# Patient Record
Sex: Female | Born: 1935 | Race: White | Hispanic: No | Marital: Married | Smoking: Never smoker
Health system: Southern US, Community
[De-identification: ages and names within clinical notes are randomized; demographics above are authoritative.]

## PROBLEM LIST (undated history)

## (undated) DIAGNOSIS — I10 Essential (primary) hypertension: Secondary | ICD-10-CM

## (undated) DIAGNOSIS — K219 Gastro-esophageal reflux disease without esophagitis: Secondary | ICD-10-CM

## (undated) DIAGNOSIS — E039 Hypothyroidism, unspecified: Secondary | ICD-10-CM

## (undated) DIAGNOSIS — N183 Chronic kidney disease, stage 3 unspecified: Secondary | ICD-10-CM

## (undated) DIAGNOSIS — R7303 Prediabetes: Secondary | ICD-10-CM

## (undated) DIAGNOSIS — E119 Type 2 diabetes mellitus without complications: Secondary | ICD-10-CM

## (undated) DIAGNOSIS — C9000 Multiple myeloma not having achieved remission: Secondary | ICD-10-CM

## (undated) DIAGNOSIS — J45909 Unspecified asthma, uncomplicated: Secondary | ICD-10-CM

## (undated) DIAGNOSIS — E785 Hyperlipidemia, unspecified: Secondary | ICD-10-CM

## (undated) DIAGNOSIS — C569 Malignant neoplasm of unspecified ovary: Secondary | ICD-10-CM

## (undated) DIAGNOSIS — D638 Anemia in other chronic diseases classified elsewhere: Secondary | ICD-10-CM

## (undated) DIAGNOSIS — D472 Monoclonal gammopathy: Secondary | ICD-10-CM

## (undated) DIAGNOSIS — G709 Myoneural disorder, unspecified: Secondary | ICD-10-CM

## (undated) HISTORY — PX: APPENDECTOMY: SHX54

## (undated) HISTORY — DX: Gastro-esophageal reflux disease without esophagitis: K21.9

## (undated) HISTORY — DX: Chronic kidney disease, stage 3 unspecified: N18.30

## (undated) HISTORY — DX: Anemia in other chronic diseases classified elsewhere: D63.8

## (undated) HISTORY — DX: Chronic kidney disease, stage 3 (moderate): N18.3

## (undated) HISTORY — DX: Essential (primary) hypertension: I10

## (undated) HISTORY — PX: TOTAL ABDOMINAL HYSTERECTOMY W/ BILATERAL SALPINGOOPHORECTOMY: SHX83

## (undated) HISTORY — DX: Myoneural disorder, unspecified: G70.9

## (undated) HISTORY — DX: Monoclonal gammopathy: D47.2

## (undated) HISTORY — PX: THYROIDECTOMY, PARTIAL: SHX18

## (undated) HISTORY — DX: Hyperlipidemia, unspecified: E78.5

## (undated) HISTORY — DX: Unspecified asthma, uncomplicated: J45.909

## (undated) HISTORY — DX: Prediabetes: R73.03

## (undated) HISTORY — DX: Malignant neoplasm of unspecified ovary: C56.9

## (undated) HISTORY — PX: INCONTINENCE SURGERY: SHX676

## (undated) HISTORY — DX: Type 2 diabetes mellitus without complications: E11.9

## (undated) HISTORY — DX: Multiple myeloma not having achieved remission: C90.00

## (undated) HISTORY — DX: Hypothyroidism, unspecified: E03.9

## (undated) HISTORY — PX: OTHER SURGICAL HISTORY: SHX169

---

## 1970-12-27 DIAGNOSIS — C569 Malignant neoplasm of unspecified ovary: Secondary | ICD-10-CM

## 1970-12-27 HISTORY — DX: Malignant neoplasm of unspecified ovary: C56.9

## 2006-12-27 HISTORY — PX: COLONOSCOPY: SHX174

## 2012-05-30 ENCOUNTER — Telehealth: Payer: Self-pay | Admitting: Oncology

## 2012-05-30 NOTE — Telephone Encounter (Signed)
called pts home lmovm to rtn call to schedule appt °

## 2012-05-31 ENCOUNTER — Telehealth: Payer: Self-pay | Admitting: Oncology

## 2012-05-31 NOTE — Telephone Encounter (Signed)
called pts home lmovm to rtn call to schedule appt °

## 2012-06-01 ENCOUNTER — Telehealth: Payer: Self-pay | Admitting: Oncology

## 2012-06-01 NOTE — Telephone Encounter (Signed)
Referred by Dr. Elvis Coil Dx- M-Spike

## 2012-06-13 ENCOUNTER — Encounter: Payer: Self-pay | Admitting: Oncology

## 2012-06-13 DIAGNOSIS — N184 Chronic kidney disease, stage 4 (severe): Secondary | ICD-10-CM | POA: Insufficient documentation

## 2012-06-13 DIAGNOSIS — D472 Monoclonal gammopathy: Secondary | ICD-10-CM | POA: Insufficient documentation

## 2012-06-13 DIAGNOSIS — E785 Hyperlipidemia, unspecified: Secondary | ICD-10-CM | POA: Insufficient documentation

## 2012-06-13 DIAGNOSIS — I10 Essential (primary) hypertension: Secondary | ICD-10-CM | POA: Insufficient documentation

## 2012-06-13 DIAGNOSIS — E039 Hypothyroidism, unspecified: Secondary | ICD-10-CM | POA: Insufficient documentation

## 2012-06-13 NOTE — Patient Instructions (Addendum)
A.  Issue:  Abnormal protein in the urine. B.  Need to rule out multiple myeloma or related diseases such as light chain deposition disease or amyloidosis. C.  Work up:  24 hour urine collection.  Bone marrow biopsy; potential kidney biopsy; skeletal X-ray to rule out bone lytic lesions.  D.  Follow up:  After these tests to go over result and if there is any need for treatment.

## 2012-06-14 ENCOUNTER — Ambulatory Visit (HOSPITAL_BASED_OUTPATIENT_CLINIC_OR_DEPARTMENT_OTHER): Payer: Medicare Other | Admitting: Oncology

## 2012-06-14 ENCOUNTER — Ambulatory Visit: Payer: Self-pay

## 2012-06-14 ENCOUNTER — Telehealth: Payer: Self-pay | Admitting: Oncology

## 2012-06-14 ENCOUNTER — Encounter: Payer: Self-pay | Admitting: Oncology

## 2012-06-14 ENCOUNTER — Other Ambulatory Visit (HOSPITAL_BASED_OUTPATIENT_CLINIC_OR_DEPARTMENT_OTHER): Payer: Medicare Other | Admitting: Lab

## 2012-06-14 VITALS — BP 185/79 | HR 66 | Temp 96.9°F | Ht 63.0 in | Wt 184.4 lb

## 2012-06-14 DIAGNOSIS — E785 Hyperlipidemia, unspecified: Secondary | ICD-10-CM

## 2012-06-14 DIAGNOSIS — E039 Hypothyroidism, unspecified: Secondary | ICD-10-CM

## 2012-06-14 DIAGNOSIS — R809 Proteinuria, unspecified: Secondary | ICD-10-CM

## 2012-06-14 DIAGNOSIS — R0602 Shortness of breath: Secondary | ICD-10-CM

## 2012-06-14 DIAGNOSIS — D472 Monoclonal gammopathy: Secondary | ICD-10-CM

## 2012-06-14 DIAGNOSIS — I10 Essential (primary) hypertension: Secondary | ICD-10-CM

## 2012-06-14 DIAGNOSIS — N189 Chronic kidney disease, unspecified: Secondary | ICD-10-CM

## 2012-06-14 DIAGNOSIS — I129 Hypertensive chronic kidney disease with stage 1 through stage 4 chronic kidney disease, or unspecified chronic kidney disease: Secondary | ICD-10-CM

## 2012-06-14 NOTE — Progress Notes (Signed)
[facility]  Telephone:(336) [phone] Fax:(336) [phone]     INITIAL HEMATOLOGY CONSULTATION    Referral MD:  Dr. Elvis Coil, M.D.   Reason for Referral: positive UPEP.    HPI:  Jessica Byrd is an 78 year old woman with history of HTN, HLP.  She was recently noticed to have elevated Cr.  She was referred to Dr. Hebert Soho who found her to have positive UPEP.  She was thus kindly referred to the [facility] for evaluation.  Jessica Byrd presented to the Cancer Center for the first time today with her son.  She reports that she feels relatively well.  She noticed that her urine output has slightly decreased over the last few months.  She denied frothy urine.  She does have baseline SOB and DOE with more than 100 ft.  This has been ongoing for years.  She denied pedal edema, PND, orthopnea.  She has thin skin and easy bruisabilty for years.  She denied visible source of bleeding.  She has mild fatigue; however, she is still independent of activities of daily living.   Patient denies fever, anorexia, weight loss, headache, visual changes, confusion, drenching night sweats, palpable lymph node swelling, mucositis, odynophagia, dysphagia, nausea vomiting, jaundice, chest pain, palpitation, productive cough, gum bleeding, epistaxis, hematemesis, hemoptysis, abdominal pain, abdominal swelling, early satiety, melena, hematochezia, hematuria, skin rash, spontaneous bleeding, joint swelling, joint pain, heat or cold intolerance, bowel bladder incontinence, back pain, focal motor weakness, paresthesia, depression, suicidal or homocidal ideation, feeling hopelessness.    Past Medical History  Diagnosis Date  . HTN (hypertension)   . Hyperlipidemia   . Asthma   . GERD (gastroesophageal reflux disease)   . Hypothyroid   . Monoclonal gammopathies   . CKD (chronic kidney disease) stage 3, GFR 30-59 ml/min   . Borderline diabetes mellitus   . Ovarian cancer 1972   reportedly early stage; s/p TAH/BSO and adjuvant radaition.  :    Past Surgical History  Procedure Date  . Total abdominal hysterectomy w/ bilateral salpingoophorectomy   . Umibical hernia repair   . Right rotator cuff surgery   . Thyroidectomy, partial   . Vocal cord repair   . Colonoscopy 2008    reportedly negative per patient.   :   CURRENT MEDS: Current Outpatient Prescriptions  Medication Sig Dispense Refill  . albuterol (VENTOLIN HFA) 108 (90 BASE) MCG/ACT inhaler Inhale 2 puffs into the lungs every 6 (six) hours as needed.      Marland Kitchen amLODipine (NORVASC) 10 MG tablet Take 10 mg by mouth daily.      Marland Kitchen aspirin 81 MG tablet Take 81 mg by mouth every other day.      . Calcium-Vitamin D (CALTRATE 600 PLUS-VIT D PO) Take 1 capsule by mouth 2 (two) times daily.      . Glucosamine-Chondroit-Vit C-Mn (GLUCOSAMINE CHONDR 1500 COMPLX PO) Take by mouth daily.      Marland Kitchen levothyroxine (SYNTHROID, LEVOTHROID) 75 MCG tablet Take 75 mcg by mouth daily.      Marland Kitchen lisinopril-hydrochlorothiazide (PRINZIDE,ZESTORETIC) 20-12.5 MG per tablet Take 1 tablet by mouth daily.      Marland Kitchen loperamide (IMODIUM A-D) 2 MG tablet Take 2 mg by mouth 2 (two) times daily.      Marland Kitchen omeprazole (PRILOSEC) 20 MG capsule Take 20 mg by mouth daily.      . pravastatin (PRAVACHOL) 40 MG tablet Take 80 mg by mouth daily.      . psyllium (METAMUCIL)  58.6 % powder Take 1 packet by mouth 2 (two) times daily.          Allergies  Allergen Reactions  . Penicillins Other (See Comments)    Stiffness in hands  :  Family History  Problem Relation Age of Onset  . Cancer Father 25    gastric   . Cancer Sister     lung cancer  . Cancer Brother 57    cholangiocarcinoma  :  History   Social History  . Marital Status: Married    Spouse Name: N/A    Number of Children: 1  . Years of Education: N/A   Occupational History  .      retired.    Social History Main Topics  . Smoking status: Not on file  . Smokeless tobacco: Not  on file  . Alcohol Use:   . Drug Use:   . Sexually Active:    Other Topics Concern  . Not on file   Social History Narrative  . No narrative on file  :  REVIEW OF SYSTEM:  The rest of the 14-point review of sytem was negative.   Exam: ECOG 1  General:  Mildly obese woman in no acute distress.  Eyes:  no scleral icterus.  ENT:  There were no oropharyngeal lesions.  There were scalloping marks on her lateral tongue.  Neck was without thyromegaly.  Lymphatics:  Negative cervical, supraclavicular or axillary adenopathy.  Respiratory: lungs were clear bilaterally without wheezing or crackles.  Cardiovascular:  Regular rate and rhythm, S1/S2, without murmur, rub or gallop.  There was no pedal edema.  GI:  abdomen was soft, flat, nontender, nondistended, without organomegaly.  Muscoloskeletal:  no spinal tenderness of palpation of vertebral spine.  Skin exam was without echymosis, petichae.  Neuro exam was nonfocal.  Patient was able to get on and off exam table without assistance.  Gait was normal.  Patient was alerted and oriented.  Attention was good.   Language was appropriate.  Mood was normal without depression.  Speech was not pressured.  Thought content was not tangential.    LABS:  From Dr. Marland Mcalpine office dated 05/16/2012 WBC 8.2; Hgb 11.1; Hct 32.6; MCV 93; Plt 205 Cr 1.2; Tprot 6.6; Alb 4.2; Ca 9.6.  Rest of LFT within normal limit.  SPEP:  Negative. UPEP:  Positive for kappa Bence Jones Protein.   ASSESSMENT AND PLAN:   1.   Positive UPEP for kappa Bence Jones Protein:   - Differentials:  Need to rule out multiple myeloma or related diseases such as light chain deposition disease or amyloidosis.  If negative, then the dx would be MGUS.  -Work up:  24 hour urine collection to quantify the total protein and amount of Bence Jones Protein.    Bone marrow biopsy to assess for % of plasma cell; kidney biopsy to rule out light chain deposition disease or amyloid; skeletal X-ray to rule out  bone lytic lesions.   She and her son agreed with this plan and wished to proceed.  - Treatment:  Pending result of work up.   2.  Hypertension:  Elevated BP today with first visit to the Cancer Center.  She is on amlodipine, Lisinopril/HCTZ.   3.  Hyperlipidemia:  She is on pravastatin.  4.  Chronic kidney disease, stage III:  Either from HTN.  But need to rule out plasma cell dyscrasia process (see #1).  5.  Cough, SOB:  Attributed to asthma.  However, if she  has amyloidosis, I will then request an echo to rule out CHF.     Thank you for this referral.     The length of time of the face-to-face encounter was 60 minutes. More than 50% of time was spent counseling and coordination of care.

## 2012-06-14 NOTE — Telephone Encounter (Signed)
gv pt appt for [id number].  scheduled bone survey for 06/25 @ [facility].  Informed pt that [facility] will call to scheduled bone marrow appt

## 2012-06-15 LAB — KAPPA/LAMBDA LIGHT CHAINS: Kappa:Lambda Ratio: 4.65 — ABNORMAL HIGH (ref 0.26–1.65)

## 2012-06-15 LAB — IGG, IGA, IGM
IgA: 261 mg/dL (ref 69–380)
IgG (Immunoglobin G), Serum: 878 mg/dL (ref 690–1700)

## 2012-06-20 ENCOUNTER — Ambulatory Visit (HOSPITAL_COMMUNITY)
Admission: RE | Admit: 2012-06-20 | Discharge: 2012-06-20 | Disposition: A | Discharge status: Home / Self Care | Payer: Medicare Other | Source: Ambulatory Visit | Attending: Oncology | Admitting: Oncology

## 2012-06-20 ENCOUNTER — Ambulatory Visit: Payer: Medicare Other

## 2012-06-20 DIAGNOSIS — M949 Disorder of cartilage, unspecified: Secondary | ICD-10-CM | POA: Insufficient documentation

## 2012-06-20 DIAGNOSIS — M5030 Other cervical disc degeneration, unspecified cervical region: Secondary | ICD-10-CM | POA: Insufficient documentation

## 2012-06-20 DIAGNOSIS — I10 Essential (primary) hypertension: Secondary | ICD-10-CM

## 2012-06-20 DIAGNOSIS — E039 Hypothyroidism, unspecified: Secondary | ICD-10-CM

## 2012-06-20 DIAGNOSIS — X58XXXA Exposure to other specified factors, initial encounter: Secondary | ICD-10-CM | POA: Insufficient documentation

## 2012-06-20 DIAGNOSIS — E785 Hyperlipidemia, unspecified: Secondary | ICD-10-CM

## 2012-06-20 DIAGNOSIS — M899 Disorder of bone, unspecified: Secondary | ICD-10-CM | POA: Insufficient documentation

## 2012-06-20 DIAGNOSIS — M4120 Other idiopathic scoliosis, site unspecified: Secondary | ICD-10-CM | POA: Insufficient documentation

## 2012-06-20 DIAGNOSIS — N183 Chronic kidney disease, stage 3 unspecified: Secondary | ICD-10-CM

## 2012-06-20 DIAGNOSIS — S32009A Unspecified fracture of unspecified lumbar vertebra, initial encounter for closed fracture: Secondary | ICD-10-CM | POA: Insufficient documentation

## 2012-06-20 DIAGNOSIS — D472 Monoclonal gammopathy: Secondary | ICD-10-CM

## 2012-06-23 ENCOUNTER — Other Ambulatory Visit: Payer: Self-pay | Admitting: Radiology

## 2012-06-23 LAB — UIFE/LIGHT CHAINS/TP QN, 24-HR UR
Alpha 2, Urine: DETECTED — AB
Free Kappa/Lambda Ratio: 142.73 ratio — ABNORMAL HIGH (ref 2.04–10.37)
Free Lambda Lt Chains,Ur: 0.11 mg/dL (ref 0.02–0.67)
Free Lt Chn Excr Rate: 94.2 mg/d
Total Protein, Urine-Ur/day: 98 mg/d (ref 10–140)
Total Protein, Urine: 16.3 mg/dL

## 2012-06-26 ENCOUNTER — Other Ambulatory Visit: Payer: Self-pay | Admitting: Radiology

## 2012-06-27 ENCOUNTER — Other Ambulatory Visit: Payer: Self-pay | Admitting: Radiology

## 2012-06-28 ENCOUNTER — Ambulatory Visit (HOSPITAL_COMMUNITY)
Admission: RE | Admit: 2012-06-28 | Discharge: 2012-06-28 | Disposition: A | Discharge status: Home / Self Care | Payer: Medicare Other | Source: Ambulatory Visit | Attending: Oncology | Admitting: Oncology

## 2012-06-28 DIAGNOSIS — D472 Monoclonal gammopathy: Secondary | ICD-10-CM

## 2012-06-28 DIAGNOSIS — E785 Hyperlipidemia, unspecified: Secondary | ICD-10-CM

## 2012-06-28 DIAGNOSIS — I10 Essential (primary) hypertension: Secondary | ICD-10-CM

## 2012-06-28 DIAGNOSIS — E039 Hypothyroidism, unspecified: Secondary | ICD-10-CM

## 2012-06-28 DIAGNOSIS — D649 Anemia, unspecified: Secondary | ICD-10-CM | POA: Insufficient documentation

## 2012-06-28 DIAGNOSIS — C9000 Multiple myeloma not having achieved remission: Secondary | ICD-10-CM | POA: Insufficient documentation

## 2012-06-28 LAB — CBC
HCT: 34.8 % — ABNORMAL LOW (ref 36.0–46.0)
Hemoglobin: 11.6 g/dL — ABNORMAL LOW (ref 12.0–15.0)
MCH: 31.3 pg (ref 26.0–34.0)
MCHC: 33.3 g/dL (ref 30.0–36.0)
MCV: 93.8 fL (ref 78.0–100.0)
RDW: 12.9 % (ref 11.5–15.5)

## 2012-06-28 MED ORDER — MIDAZOLAM HCL 5 MG/5ML IJ SOLN
INTRAMUSCULAR | Status: AC | PRN
  Administered 2012-06-28 (×2): 2 mg via INTRAVENOUS

## 2012-06-28 MED ORDER — MIDAZOLAM HCL 2 MG/2ML IJ SOLN
INTRAMUSCULAR | Status: AC
  Filled 2012-06-28: qty 6

## 2012-06-28 MED ORDER — FENTANYL CITRATE 0.05 MG/ML IJ SOLN
INTRAMUSCULAR | Status: AC
  Filled 2012-06-28: qty 6

## 2012-06-28 MED ORDER — FENTANYL CITRATE 0.05 MG/ML IJ SOLN
INTRAMUSCULAR | Status: AC | PRN
  Administered 2012-06-28 (×2): 50 ug via INTRAVENOUS

## 2012-06-28 MED ORDER — SODIUM CHLORIDE 0.9 % IV SOLN
Freq: Once | INTRAVENOUS | Status: AC
  Administered 2012-06-28: 500 mL via INTRAVENOUS

## 2012-06-28 NOTE — Procedures (Signed)
Renal Bx, Core, R kidney, 16 g times 3 BM Bx R iliac 11 gauge times 1 No comp

## 2012-06-28 NOTE — H&P (Signed)
Jessica Byrd is an 78 y.o. female.   Chief Complaint: "I'm here for a bone marrow and kidney biopsy" HPI: Patient with history of positive UPEP for kappa Bence Jones Protein, HTN, and  chronic kidney disease presents today for CT guided bone marrow and random renal biopsies.  Past Medical History  Diagnosis Date  . HTN (hypertension)   . Hyperlipidemia   . Asthma   . GERD (gastroesophageal reflux disease)   . Hypothyroid   . Monoclonal gammopathies   . CKD (chronic kidney disease) stage 3, GFR 30-59 ml/min   . Borderline diabetes mellitus   . Ovarian cancer 1972    reportedly early stage; s/p TAH/BSO and adjuvant radaition.    Past Surgical History  Procedure Date  . Total abdominal hysterectomy w/ bilateral salpingoophorectomy   . Umibical hernia repair   . Right rotator cuff surgery   . Thyroidectomy, partial   . Vocal cord repair   . Colonoscopy 2008    reportedly negative per patient.     Family History  Problem Relation Age of Onset  . Cancer Father 10    gastric   . Cancer Sister     lung cancer  . Cancer Brother 36    cholangiocarcinoma   Social History:  does not have a smoking history on file. She does not have any smokeless tobacco history on file. Her alcohol and drug histories not on file.  Allergies:  Allergies  Allergen Reactions  . Penicillins Other (See Comments)    Stiffness in hands    Current outpatient prescriptions:albuterol (VENTOLIN HFA) 108 (90 BASE) MCG/ACT inhaler, Inhale 2 puffs into the lungs every 6 (six) hours as needed. For wheezing, Disp: , Rfl: ;  amLODipine (NORVASC) 10 MG tablet, Take 10 mg by mouth daily with breakfast. , Disp: , Rfl: ;  aspirin 81 MG tablet, Take 81 mg by mouth every other day., Disp: , Rfl:  Calcium-Vitamin D (CALTRATE 600 PLUS-VIT D PO), Take 1 capsule by mouth 2 (two) times daily., Disp: , Rfl: ;  Glucosamine-Chondroit-Vit C-Mn (GLUCOSAMINE CHONDR 1500 COMPLX PO), Take 1 tablet by mouth daily. , Disp: ,  Rfl: ;  levothyroxine (SYNTHROID, LEVOTHROID) 75 MCG tablet, Take 75 mcg by mouth daily before breakfast. , Disp: , Rfl:  lisinopril-hydrochlorothiazide (PRINZIDE,ZESTORETIC) 20-12.5 MG per tablet, Take 1 tablet by mouth daily with breakfast. , Disp: , Rfl: ;  loperamide (IMODIUM A-D) 2 MG tablet, Take 2 mg by mouth daily with breakfast. , Disp: , Rfl: ;  omeprazole (PRILOSEC) 20 MG capsule, Take 20 mg by mouth daily with breakfast. , Disp: , Rfl: ;  pravastatin (PRAVACHOL) 40 MG tablet, Take 80 mg by mouth every evening. , Disp: , Rfl:  psyllium (METAMUCIL) 58.6 % powder, Take 1 packet by mouth 2 (two) times daily., Disp: , Rfl:  No current facility-administered medications for this encounter. Facility-Administered Medications Ordered in Other Encounters: 0.9 %  sodium chloride infusion, , Intravenous, Once, Brayton El, PA, Last Rate: 20 mL/hr at 06/28/12 0814, 500 mL at 06/28/12 0814;  fentaNYL (SUBLIMAZE) 0.05 MG/ML injection, , , , ;  midazolam (VERSED) 2 MG/2ML injection, , , ,    Results for orders placed during the hospital encounter of 06/28/12 (from the past 48 hour(s))  GLUCOSE, CAPILLARY     Status: Abnormal   Collection Time   06/28/12  8:19 AM      Component Value Range Comment   Glucose-Capillary 117 (*) 70 - 99 mg/dL    Results  for orders placed during the hospital encounter of 06/28/12  GLUCOSE, CAPILLARY      Component Value Range   Glucose-Capillary 117 (*) 70 - 99 mg/dL   Results for orders placed during the hospital encounter of 06/28/12  GLUCOSE, CAPILLARY      Component Value Range   Glucose-Capillary 117 (*) 70 - 99 mg/dL  07/03/45   WBC 8.0  HGB  11.6  PLTS  190K   PT  13  INR  0.96  PTT 32  Review of Systems  Constitutional: Positive for malaise/fatigue. Negative for fever, chills and weight loss.  Respiratory: Positive for cough and shortness of breath.   Cardiovascular: Negative for chest pain.  Gastrointestinal: Negative for nausea, vomiting and abdominal pain.   Musculoskeletal: Negative for back pain.  Neurological: Negative for headaches.  Endo/Heme/Allergies: Does not bruise/bleed easily.    Blood pressure 159/66, pulse 70, temperature 98.4 F (36.9 C), temperature source Oral, resp. rate 18, height 5\' 3"  (1.6 m), weight 184 lb (83.462 kg), SpO2 99.00%. Physical Exam  Constitutional: She is oriented to person, place, and time. She appears well-developed and well-nourished.  Cardiovascular: Normal rate and regular rhythm.   Respiratory: Effort normal.       Few scattered insp/exp wheezes  GI: Soft. Bowel sounds are normal. There is no tenderness.  Musculoskeletal: Normal range of motion. She exhibits no edema.  Neurological: She is alert and oriented to person, place, and time.     Assessment/Plan Patient with positive UPEP for kappa Bence Jones Protein, HTN and chronic kidney disease; plan is for CT guided bone marrow and random renal biopsies. Details/risks of above d/w pt /son with their understanding and consent.  Geordie Nooney,D KEVIN 06/28/2012, 8:47 AM

## 2012-07-07 ENCOUNTER — Other Ambulatory Visit (HOSPITAL_BASED_OUTPATIENT_CLINIC_OR_DEPARTMENT_OTHER): Payer: Medicare Other | Admitting: Lab

## 2012-07-07 ENCOUNTER — Ambulatory Visit (HOSPITAL_BASED_OUTPATIENT_CLINIC_OR_DEPARTMENT_OTHER): Payer: Medicare Other | Admitting: Oncology

## 2012-07-07 ENCOUNTER — Telehealth: Payer: Self-pay | Admitting: Oncology

## 2012-07-07 ENCOUNTER — Encounter: Payer: Self-pay | Admitting: Oncology

## 2012-07-07 VITALS — BP 180/76 | HR 72 | Temp 97.5°F | Ht 63.0 in | Wt 186.2 lb

## 2012-07-07 DIAGNOSIS — I10 Essential (primary) hypertension: Secondary | ICD-10-CM

## 2012-07-07 DIAGNOSIS — D472 Monoclonal gammopathy: Secondary | ICD-10-CM

## 2012-07-07 DIAGNOSIS — E785 Hyperlipidemia, unspecified: Secondary | ICD-10-CM

## 2012-07-07 DIAGNOSIS — E039 Hypothyroidism, unspecified: Secondary | ICD-10-CM

## 2012-07-07 HISTORY — DX: Monoclonal gammopathy: D47.2

## 2012-07-07 LAB — CBC WITH DIFFERENTIAL/PLATELET
Basophils Absolute: 0 10*3/uL (ref 0.0–0.1)
Eosinophils Absolute: 0.2 10*3/uL (ref 0.0–0.5)
HCT: 33 % — ABNORMAL LOW (ref 34.8–46.6)
HGB: 11 g/dL — ABNORMAL LOW (ref 11.6–15.9)
LYMPH%: 16.7 % (ref 14.0–49.7)
MCV: 94.9 fL (ref 79.5–101.0)
MONO%: 8.8 % (ref 0.0–14.0)
NEUT#: 6.5 10*3/uL (ref 1.5–6.5)
NEUT%: 71.5 % (ref 38.4–76.8)
Platelets: 172 10*3/uL (ref 145–400)
RDW: 12.8 % (ref 11.2–14.5)

## 2012-07-07 LAB — COMPREHENSIVE METABOLIC PANEL
Albumin: 4 g/dL (ref 3.5–5.2)
Alkaline Phosphatase: 62 U/L (ref 39–117)
BUN: 35 mg/dL — ABNORMAL HIGH (ref 6–23)
Creatinine, Ser: 1.28 mg/dL — ABNORMAL HIGH (ref 0.50–1.10)
Glucose, Bld: 118 mg/dL — ABNORMAL HIGH (ref 70–99)
Potassium: 4.2 mEq/L (ref 3.5–5.3)

## 2012-07-07 NOTE — Progress Notes (Signed)
[facility]  Telephone:(336) [phone] Fax:(336) [phone]   OFFICE PROGRESS NOTE   Cc:  DAVIS,SALLY, PA  DIAGNOSIS:  Monoclonal gammopathy of undetermined significance (MGUS)   CURRENT THERAPY: Watchful observation.   INTERVAL HISTORY: Jessica Byrd 78 y.o. female returns for regular follow up with her son.  She reports feeling well.  Her bone marrow biopsy site and the left kidney biopsy site have healed.  She denied back pain or flank pain.    Patient denies fever, anorexia, weight loss, fatigue, headache, visual changes, confusion, drenching night sweats, palpable lymph node swelling, mucositis, odynophagia, dysphagia, nausea vomiting, jaundice, chest pain, palpitation, shortness of breath, dyspnea on exertion, productive cough, gum bleeding, epistaxis, hematemesis, hemoptysis, abdominal pain, abdominal swelling, early satiety, melena, hematochezia, hematuria, skin rash, spontaneous bleeding, joint swelling, joint pain, heat or cold intolerance, bowel bladder incontinence, back pain, focal motor weakness, paresthesia, depression, suicidal or homocidal ideation, feeling hopelessness.   Past Medical History  Diagnosis Date  . HTN (hypertension)   . Hyperlipidemia   . Asthma   . GERD (gastroesophageal reflux disease)   . Hypothyroid   . Monoclonal gammopathies   . CKD (chronic kidney disease) stage 3, GFR 30-59 ml/min   . Borderline diabetes mellitus   . Ovarian cancer 1972    reportedly early stage; s/p TAH/BSO and adjuvant radaition.  Marland Kitchen MGUS (monoclonal gammopathy of unknown significance) 07/07/2012    Past Surgical History  Procedure Date  . Total abdominal hysterectomy w/ bilateral salpingoophorectomy   . Umibical hernia repair   . Right rotator cuff surgery   . Thyroidectomy, partial   . Vocal cord repair   . Colonoscopy 2008    reportedly negative per patient.     Current Outpatient Prescriptions  Medication Sig Dispense Refill  . albuterol  (VENTOLIN HFA) 108 (90 BASE) MCG/ACT inhaler Inhale 2 puffs into the lungs every 6 (six) hours as needed. For wheezing      . amLODipine (NORVASC) 10 MG tablet Take 10 mg by mouth daily with breakfast.       . aspirin 81 MG tablet Take 81 mg by mouth every other day.      . Calcium-Vitamin D (CALTRATE 600 PLUS-VIT D PO) Take 1 capsule by mouth 2 (two) times daily.      . Glucosamine-Chondroit-Vit C-Mn (GLUCOSAMINE CHONDR 1500 COMPLX PO) Take 1 tablet by mouth daily.       Marland Kitchen levothyroxine (SYNTHROID, LEVOTHROID) 75 MCG tablet Take 75 mcg by mouth daily before breakfast.       . lisinopril-hydrochlorothiazide (PRINZIDE,ZESTORETIC) 20-12.5 MG per tablet Take 1 tablet by mouth daily with breakfast.       . loperamide (IMODIUM A-D) 2 MG tablet Take 2 mg by mouth daily with breakfast.       . omeprazole (PRILOSEC) 20 MG capsule Take 20 mg by mouth daily with breakfast.       . pravastatin (PRAVACHOL) 40 MG tablet Take 80 mg by mouth every evening.       . psyllium (METAMUCIL) 58.6 % powder Take 1 packet by mouth 2 (two) times daily.        ALLERGIES:  is allergic to penicillins.  REVIEW OF SYSTEMS:  The rest of the 14-point review of system was negative.   Filed Vitals:   07/07/12 1330  BP: 180/76  Pulse: 72  Temp: 97.5 F (36.4 C)   Wt Readings from Last 3 Encounters:  07/07/12 186 lb 3.2 oz (84.46 kg)  06/28/12 184  lb (83.462 kg)  06/14/12 184 lb 6.4 oz (83.643 kg)   ECOG Performance status: 0  PHYSICAL EXAMINATION:   General:  well-nourished woman in no acute distress.  Eyes:  no scleral icterus.  ENT:  There were no oropharyngeal lesions.  Neck was without thyromegaly.  Lymphatics:  Negative cervical, supraclavicular or axillary adenopathy.  Respiratory: lungs were clear bilaterally without wheezing or crackles.  Cardiovascular:  Regular rate and rhythm, S1/S2, without murmur, rub or gallop.  There was no pedal edema.  GI:  abdomen was soft, flat, nontender, nondistended, without  organomegaly.  Muscoloskeletal:  no spinal tenderness of palpation of vertebral spine.  Bone marrow biopsy and left kidney biopsy sites have healed without erythema, ecchymosis.  Skin exam was without echymosis, petichae.  Neuro exam was nonfocal.     LABORATORY/RADIOLOGY DATA:  Lab Results  Component Value Date   WBC 9.1 07/07/2012   HGB 11.0* 07/07/2012   HCT 33.0* 07/07/2012   PLT 172 07/07/2012   INR 0.96 06/28/2012    Dg Bone Survey Met  06/20/2012  *RADIOLOGY REPORT*  Clinical Data: Evaluate lytic lesions.  METASTATIC BONE SURVEY  Comparison: None.  Findings: Skull:  No focal lytic lesion or acute bony abnormality.  Cervical spine:  Diffuse degenerative disc disease and facet disease.  Left carotid calcifications.  No focal lytic lesion or acute bony abnormality.  Thoracic spine:  Mild degenerative changes.  Slight levoscoliosis in the lower thoracic spine.  No focal lytic lesion or acute bony abnormality.  Lumbar spine:  Mild rightward scoliosis.  Mild compression fracture at L2.  No focal lytic lesion.  Pelvis:  Mild osteopenia.  No focal lytic lesion or acute bony abnormality.  Bilateral femurs:  No focal lytic lesion or acute bony abnormality.  Bilateral humeri:  Severe degenerative changes in the shoulders bilaterally, right greater than left.  No focal lytic lesion or acute bony abnormality.  IMPRESSION: No focal lytic lesions visualized.  Mild compression fracture at L2.  Chronic changes as discussed above.  Original Report Authenticated By: Cyndie Chime, M.D.       ASSESSMENT AND PLAN:   1. Positive UPEP for kappa Bence Jones Protein:   - Most consistent with MGUS.  Her bone marrow biopsy only showed 2% plasma cell and no amyloidosis stain.  Her skeletal survey did not show lytic bone lesions.  These ruled out myeloma and amyloidosis.  Her kidney biopsy showed severe arterioscleosis with mild to moderate tubulointerstitial scarring.  There was no sign of light chain deposition  disease or amyloidosis.    -Prognosis:  In patients more than 76 years old, it is common to see MGUS.  About 25% of patients with MGUS may develop active myeloma with 10 year follow up.   - Treatment:  There is no indication for chemotherapy in MGUS.   - Follow up:  In about 4 months at the [facility] with myeloma work up.  In the future, if she develops severe anemia, renal dysfunction, increasing M-spike or serum free light chain, then I may consider repeating bone marrow biopsy.   2. Hypertension:  She is on amlodipine, Lisinopril/HCTZ.  3. Hyperlipidemia: She is on pravastatin.  4. Chronic kidney disease, stage III: from HTN per kidney biopsy.  Follow up with renal.  5. Slight normocytic anemia:  Due to CKD.  There is no active bleeding.  There is no need for transfusion.       The length of time of the face-to-face encounter was  15 minutes. More than 50% of time was spent counseling and coordination of care.

## 2012-07-07 NOTE — Patient Instructions (Signed)
1.  Positive for abnormal protein in urine collection:   - You do not have active myeloma.  Your skeletal X-ray showed no evidence of lytic lesion.  Bone marrow biopsy only showed 2% plasma cell (normal).  Your kidney biopsy did not show myeloma in the kidney.  - Your diagnosis is best labeled as MGUS (monoclonal gammopathy of undetermined significance).   About 25% of patients with MGUS may develop in to active myeloma with 10 years follow up.  There is no indication for chemotherapy for MGUS.   2.  Follow up:  In about 4 months to ensure that your abnormal protein level is stable.  In the future, if your kidney function, anemia, calcium level significantly worsen, we may consider repeating bone marrow biopsy to ensure no progression to active myeloma.

## 2012-07-07 NOTE — Telephone Encounter (Signed)
gve the pt her nov 2013 appt calendar °

## 2012-11-07 ENCOUNTER — Other Ambulatory Visit (HOSPITAL_BASED_OUTPATIENT_CLINIC_OR_DEPARTMENT_OTHER): Payer: Medicare Other | Admitting: Lab

## 2012-11-07 DIAGNOSIS — D472 Monoclonal gammopathy: Secondary | ICD-10-CM

## 2012-11-07 LAB — COMPREHENSIVE METABOLIC PANEL (CC13)
ALT: 16 U/L (ref 0–55)
CO2: 26 mEq/L (ref 22–29)
Calcium: 9.4 mg/dL (ref 8.4–10.4)
Chloride: 109 mEq/L — ABNORMAL HIGH (ref 98–107)
Potassium: 4.3 mEq/L (ref 3.5–5.1)
Sodium: 142 mEq/L (ref 136–145)
Total Protein: 6.6 g/dL (ref 6.4–8.3)

## 2012-11-07 LAB — CBC WITH DIFFERENTIAL/PLATELET
Basophils Absolute: 0.1 10*3/uL (ref 0.0–0.1)
Eosinophils Absolute: 0.2 10*3/uL (ref 0.0–0.5)
HGB: 11.3 g/dL — ABNORMAL LOW (ref 11.6–15.9)
MCV: 93.4 fL (ref 79.5–101.0)
MONO%: 7.8 % (ref 0.0–14.0)
NEUT#: 5.1 10*3/uL (ref 1.5–6.5)
RDW: 13.4 % (ref 11.2–14.5)
lymph#: 1.9 10*3/uL (ref 0.9–3.3)

## 2012-11-09 LAB — PROTEIN ELECTROPHORESIS, SERUM
Albumin ELP: 56.4 % (ref 55.8–66.1)
Alpha-1-Globulin: 7.1 % — ABNORMAL HIGH (ref 2.9–4.9)
Beta 2: 5.1 % (ref 3.2–6.5)
Beta Globulin: 7.3 % — ABNORMAL HIGH (ref 4.7–7.2)

## 2012-11-09 LAB — KAPPA/LAMBDA LIGHT CHAINS: Kappa:Lambda Ratio: 4.91 — ABNORMAL HIGH (ref 0.26–1.65)

## 2012-11-15 ENCOUNTER — Ambulatory Visit (HOSPITAL_BASED_OUTPATIENT_CLINIC_OR_DEPARTMENT_OTHER): Payer: Medicare Other | Admitting: Oncology

## 2012-11-15 ENCOUNTER — Encounter: Payer: Self-pay | Admitting: Oncology

## 2012-11-15 ENCOUNTER — Telehealth: Payer: Self-pay | Admitting: Oncology

## 2012-11-15 ENCOUNTER — Ambulatory Visit: Payer: Medicare Other | Admitting: Oncology

## 2012-11-15 VITALS — BP 147/73 | HR 80 | Temp 96.8°F | Resp 20 | Ht 63.0 in | Wt 187.1 lb

## 2012-11-15 DIAGNOSIS — I10 Essential (primary) hypertension: Secondary | ICD-10-CM

## 2012-11-15 DIAGNOSIS — D649 Anemia, unspecified: Secondary | ICD-10-CM

## 2012-11-15 DIAGNOSIS — D472 Monoclonal gammopathy: Secondary | ICD-10-CM

## 2012-11-15 NOTE — Telephone Encounter (Signed)
gv and printed appt schedule for April 2014...the patient aware

## 2012-11-15 NOTE — Patient Instructions (Addendum)
1. Positive for abnormal protein in urine collection:  - You do not have active myeloma. Your skeletal X-ray showed no evidence of lytic lesion. Bone marrow biopsy only showed 2% plasma cell (normal). Your kidney biopsy did not show myeloma in the kidney.  - Your diagnosis is best labeled as MGUS (monoclonal gammopathy of undetermined significance). About 25% of patients with MGUS may develop in to active myeloma with 10 years follow up. There is no indication for chemotherapy for MGUS.  2. Follow up: In about 5 months to ensure that your abnormal protein level is stable. In the future, if your kidney function, anemia, calcium level significantly worsen, we may consider repeating bone marrow biopsy to ensure no progression to active myeloma.

## 2012-11-15 NOTE — Progress Notes (Signed)
[facility]  Telephone:(336) [phone] Fax:(336) [phone]   OFFICE PROGRESS NOTE   Cc:  DAVIS,SALLY, PA  DIAGNOSIS:  Monoclonal gammopathy of undetermined significance (MGUS)   CURRENT THERAPY: Watchful observation.   INTERVAL HISTORY: Jessica Byrd 78 y.o. female returns for regular follow up with a friend.  She reports feeling well. She denied back pain or flank pain.  No chest pain, shortness of breath, abdominal pain, nausea, vomiting. She has not noted any bleeding.  Patient denies fever, anorexia, weight loss, fatigue, headache, visual changes, confusion, drenching night sweats, palpable lymph node swelling, mucositis, odynophagia, dysphagia, nausea vomiting, jaundice, chest pain, palpitation, shortness of breath, dyspnea on exertion, productive cough, gum bleeding, epistaxis, hematemesis, hemoptysis, abdominal pain, abdominal swelling, early satiety, melena, hematochezia, hematuria, skin rash, spontaneous bleeding, joint swelling, joint pain, heat or cold intolerance, bowel bladder incontinence, back pain, focal motor weakness, paresthesia, depression, suicidal or homocidal ideation, feeling hopelessness.   Past Medical History  Diagnosis Date  . HTN (hypertension)   . Hyperlipidemia   . Asthma   . GERD (gastroesophageal reflux disease)   . Hypothyroid   . Monoclonal gammopathies   . CKD (chronic kidney disease) stage 3, GFR 30-59 ml/min   . Borderline diabetes mellitus   . Ovarian cancer 1972    reportedly early stage; s/p TAH/BSO and adjuvant radaition.  Marland Kitchen MGUS (monoclonal gammopathy of unknown significance) 07/07/2012    Past Surgical History  Procedure Date  . Total abdominal hysterectomy w/ bilateral salpingoophorectomy   . Umibical hernia repair   . Right rotator cuff surgery   . Thyroidectomy, partial   . Vocal cord repair   . Colonoscopy 2008    reportedly negative per patient.     Current Outpatient Prescriptions  Medication Sig  Dispense Refill  . albuterol (VENTOLIN HFA) 108 (90 BASE) MCG/ACT inhaler Inhale 2 puffs into the lungs every 6 (six) hours as needed. For wheezing      . amLODipine (NORVASC) 10 MG tablet Take 10 mg by mouth daily with breakfast.       . aspirin 81 MG tablet Take 81 mg by mouth every other day.      . Calcium-Vitamin D (CALTRATE 600 PLUS-VIT D PO) Take 1 capsule by mouth 2 (two) times daily.      . Glucosamine-Chondroit-Vit C-Mn (GLUCOSAMINE CHONDR 1500 COMPLX PO) Take 1 tablet by mouth daily.       Marland Kitchen levothyroxine (SYNTHROID, LEVOTHROID) 75 MCG tablet Take 75 mcg by mouth daily before breakfast.       . lisinopril-hydrochlorothiazide (PRINZIDE,ZESTORETIC) 20-12.5 MG per tablet Take 1 tablet by mouth daily with breakfast.       . loperamide (IMODIUM A-D) 2 MG tablet Take 2 mg by mouth daily with breakfast.       . omeprazole (PRILOSEC) 20 MG capsule Take 20 mg by mouth daily with breakfast.       . pravastatin (PRAVACHOL) 40 MG tablet Take 80 mg by mouth every evening.       . psyllium (METAMUCIL) 58.6 % powder Take 1 packet by mouth 2 (two) times daily.        ALLERGIES:  is allergic to penicillins.  REVIEW OF SYSTEMS:  The rest of the 14-point review of system was negative.   Filed Vitals:   11/15/12 1400  BP: 147/73  Pulse: 80  Temp: 96.8 F (36 C)  Resp: 20   Wt Readings from Last 3 Encounters:  11/15/12 187 lb 1.6 oz (84.868 kg)  07/07/12 186 lb 3.2 oz (84.46 kg)  06/28/12 184 lb (83.462 kg)   ECOG Performance status: 0  PHYSICAL EXAMINATION:   General:  well-nourished woman in no acute distress.  Eyes:  no scleral icterus.  ENT:  There were no oropharyngeal lesions.  Neck was without thyromegaly.  Lymphatics:  Negative cervical, supraclavicular or axillary adenopathy.  Respiratory: lungs were clear bilaterally without wheezing or crackles.  Cardiovascular:  Regular rate and rhythm, S1/S2, without murmur, rub or gallop.  There was no pedal edema.  GI:  abdomen was soft, flat,  nontender, nondistended, without organomegaly.  Muscoloskeletal:  no spinal tenderness of palpation of vertebral spine.  Bone marrow biopsy and left kidney biopsy sites have healed without erythema, ecchymosis.  Skin exam was without echymosis, petichae.  Neuro exam was nonfocal.     LABORATORY/RADIOLOGY DATA:  Lab Results  Component Value Date   WBC 7.9 11/07/2012   HGB 11.3* 11/07/2012   HCT 32.0* 11/07/2012   PLT 167 11/07/2012   GLUCOSE 96 11/07/2012   ALKPHOS 73 11/07/2012   ALT 16 11/07/2012   AST 19 11/07/2012   NA 142 11/07/2012   K 4.3 11/07/2012   CL 109* 11/07/2012   CREATININE 1.1 11/07/2012   BUN 28.0* 11/07/2012   CO2 26 11/07/2012   INR 0.96 06/28/2012    ASSESSMENT AND PLAN:   1. Positive UPEP for kappa Bence Jones Protein:   - Most consistent with MGUS.  Her bone marrow biopsy only showed 2% plasma cell and no amyloidosis stain.  Her skeletal survey did not show lytic bone lesions.  These ruled out myeloma and amyloidosis.  Her kidney biopsy showed severe arterioscleosis with mild to moderate tubulointerstitial scarring.  There was no sign of light chain deposition disease or amyloidosis.    -Prognosis:  In patients more than 76 years old, it is common to see MGUS.  About 25% of patients with MGUS may develop active myeloma with 10 year follow up.   - Treatment:  There is no indication for chemotherapy in MGUS.   - Follow up:  In about 5 months at the [facility] with myeloma work up.  In the future, if she develops severe anemia, renal dysfunction, increasing M-spike or serum free light chain, then I may consider repeating bone marrow biopsy.   2. Hypertension:  She is on amlodipine, Lisinopril/HCTZ.  3. Hyperlipidemia: She is on pravastatin.  4. Chronic kidney disease, stage III: from HTN per kidney biopsy.  Follow up with renal.  5. Slight normocytic anemia:  Due to CKD.  There is no active bleeding.  There is no need for transfusion.       The length of  time of the face-to-face encounter was 15 minutes. More than 50% of time was spent counseling and coordination of care.

## 2013-03-29 ENCOUNTER — Telehealth: Payer: Self-pay | Admitting: Oncology

## 2013-04-16 ENCOUNTER — Other Ambulatory Visit: Payer: Medicare Other | Admitting: Lab

## 2013-04-23 ENCOUNTER — Ambulatory Visit: Payer: Medicare Other | Admitting: Oncology

## 2013-04-30 ENCOUNTER — Other Ambulatory Visit (HOSPITAL_BASED_OUTPATIENT_CLINIC_OR_DEPARTMENT_OTHER): Payer: Medicare Other | Admitting: Lab

## 2013-04-30 DIAGNOSIS — D472 Monoclonal gammopathy: Secondary | ICD-10-CM

## 2013-04-30 LAB — COMPREHENSIVE METABOLIC PANEL (CC13)
ALT: 12 U/L (ref 0–55)
AST: 18 U/L (ref 5–34)
Albumin: 3.7 g/dL (ref 3.5–5.0)
Alkaline Phosphatase: 71 U/L (ref 40–150)
BUN: 43.6 mg/dL — ABNORMAL HIGH (ref 7.0–26.0)
Chloride: 106 mEq/L (ref 98–107)
Potassium: 4.3 mEq/L (ref 3.5–5.1)
Sodium: 141 mEq/L (ref 136–145)
Total Protein: 7.3 g/dL (ref 6.4–8.3)

## 2013-04-30 LAB — CBC WITH DIFFERENTIAL/PLATELET
BASO%: 0.6 % (ref 0.0–2.0)
EOS%: 1.9 % (ref 0.0–7.0)
MCH: 31.8 pg (ref 25.1–34.0)
MCHC: 34.3 g/dL (ref 31.5–36.0)
MCV: 92.6 fL (ref 79.5–101.0)
MONO%: 8.1 % (ref 0.0–14.0)
RBC: 3.5 10*6/uL — ABNORMAL LOW (ref 3.70–5.45)
RDW: 13.3 % (ref 11.2–14.5)
lymph#: 1.9 10*3/uL (ref 0.9–3.3)

## 2013-05-02 LAB — PROTEIN ELECTROPHORESIS, SERUM
Alpha-1-Globulin: 7.1 % — ABNORMAL HIGH (ref 2.9–4.9)
Alpha-2-Globulin: 13.1 % — ABNORMAL HIGH (ref 7.1–11.8)
Beta Globulin: 7.4 % — ABNORMAL HIGH (ref 4.7–7.2)
Gamma Globulin: 12 % (ref 11.1–18.8)

## 2013-05-02 LAB — KAPPA/LAMBDA LIGHT CHAINS: Kappa:Lambda Ratio: 6.77 — ABNORMAL HIGH (ref 0.26–1.65)

## 2013-05-07 ENCOUNTER — Telehealth: Payer: Self-pay | Admitting: Oncology

## 2013-05-07 ENCOUNTER — Ambulatory Visit (HOSPITAL_BASED_OUTPATIENT_CLINIC_OR_DEPARTMENT_OTHER): Payer: Medicare Other | Admitting: Oncology

## 2013-05-07 VITALS — BP 160/64 | HR 76 | Temp 98.0°F | Resp 18 | Ht 63.0 in | Wt 189.5 lb

## 2013-05-07 DIAGNOSIS — I10 Essential (primary) hypertension: Secondary | ICD-10-CM

## 2013-05-07 DIAGNOSIS — D472 Monoclonal gammopathy: Secondary | ICD-10-CM

## 2013-05-07 DIAGNOSIS — D509 Iron deficiency anemia, unspecified: Secondary | ICD-10-CM

## 2013-05-07 NOTE — Telephone Encounter (Signed)
gv pt appt schedule for August.  °

## 2013-05-08 NOTE — Progress Notes (Signed)
[facility]  Telephone:(336) [phone] Fax:(336) [phone]   OFFICE PROGRESS NOTE   Cc:  Byrd,SALLY, PA-C  DIAGNOSIS:  Monoclonal gammopathy of undetermined significance (MGUS)   CURRENT THERAPY: Watchful observation.   INTERVAL HISTORY: Jessica Byrd 79 y.o. female returns for regular follow up by herself.  She has chronic joint pain from presumed osteoporosis.  She denied taking chronic pain meds.  She otherwise has no complaint.  She denied fever, anorexia, weight loss, fatigue, headache, visual changes, confusion, drenching night sweats, palpable lymph node swelling, mucositis, odynophagia, dysphagia, nausea vomiting, jaundice, chest pain, palpitation, shortness of breath, dyspnea on exertion, productive cough, gum bleeding, epistaxis, hematemesis, hemoptysis, abdominal pain, abdominal swelling, early satiety, melena, hematochezia, hematuria, skin rash, spontaneous bleeding, joint swelling, heat or cold intolerance, bowel bladder incontinence, back pain, focal motor weakness, paresthesia.     Past Medical History  Diagnosis Date  . HTN (hypertension)   . Hyperlipidemia   . Asthma   . GERD (gastroesophageal reflux disease)   . Hypothyroid   . Monoclonal gammopathies   . CKD (chronic kidney disease) stage 3, GFR 30-59 ml/min   . Borderline diabetes mellitus   . Ovarian cancer 1972    reportedly early stage; s/p TAH/BSO and adjuvant radaition.  Marland Kitchen MGUS (monoclonal gammopathy of unknown significance) 07/07/2012    Past Surgical History  Procedure Laterality Date  . Total abdominal hysterectomy w/ bilateral salpingoophorectomy    . Umibical hernia repair    . Right rotator cuff surgery    . Thyroidectomy, partial    . Vocal cord repair    . Colonoscopy  2008    reportedly negative per patient.     Current Outpatient Prescriptions  Medication Sig Dispense Refill  . albuterol (VENTOLIN HFA) 108 (90 BASE) MCG/ACT inhaler Inhale 2 puffs into the lungs every  6 (six) hours as needed. For wheezing      . amLODipine (NORVASC) 10 MG tablet Take 10 mg by mouth daily with breakfast.       . aspirin 81 MG tablet Take 81 mg by mouth every other day.      . Calcium-Vitamin D (CALTRATE 600 PLUS-VIT D PO) Take 1 capsule by mouth 2 (two) times daily.      . Glucosamine-Chondroit-Vit C-Mn (GLUCOSAMINE CHONDR 1500 COMPLX PO) Take 1 tablet by mouth daily.       Marland Kitchen levothyroxine (SYNTHROID, LEVOTHROID) 75 MCG tablet Take 75 mcg by mouth daily before breakfast.       . lisinopril-hydrochlorothiazide (PRINZIDE,ZESTORETIC) 20-12.5 MG per tablet Take 1 tablet by mouth daily with breakfast.       . loperamide (IMODIUM A-D) 2 MG tablet Take 2 mg by mouth daily with breakfast.       . omeprazole (PRILOSEC) 20 MG capsule Take 20 mg by mouth daily with breakfast.       . pravastatin (PRAVACHOL) 40 MG tablet Take 80 mg by mouth every evening.       . psyllium (METAMUCIL) 58.6 % powder Take 1 packet by mouth 2 (two) times daily.       No current facility-administered medications for this visit.    ALLERGIES:  is allergic to penicillins.  REVIEW OF SYSTEMS:  The rest of the 14-point review of system was negative.   Filed Vitals:   05/07/13 1317  BP: 160/64  Pulse: 76  Temp: 98 F (36.7 C)  Resp: 18   Wt Readings from Last 3 Encounters:  05/07/13 189 lb 8 oz (85.957  kg)  11/15/12 187 lb 1.6 oz (84.868 kg)  07/07/12 186 lb 3.2 oz (84.46 kg)   ECOG Performance status: 0  PHYSICAL EXAMINATION:   General:  well-nourished woman in no acute distress.  Eyes:  no scleral icterus.  ENT:  There were no oropharyngeal lesions.  Neck was without thyromegaly.  Lymphatics:  Negative cervical, supraclavicular or axillary adenopathy.  Respiratory: lungs were clear bilaterally without wheezing or crackles.  Cardiovascular:  Regular rate and rhythm, S1/S2, without murmur, rub or gallop.  There was no pedal edema.  GI:  abdomen was soft, flat, nontender, nondistended, without  organomegaly.  Muscoloskeletal:  no spinal tenderness of palpation of vertebral spine.  Bone marrow biopsy and left kidney biopsy sites have healed without erythema, ecchymosis.  Skin exam was without echymosis, petichae.  Neuro exam was nonfocal.     LABORATORY/RADIOLOGY DATA:  Lab Results  Component Value Date   WBC 9.3 04/30/2013   HGB 11.1* 04/30/2013   HCT 32.4* 04/30/2013   PLT 195 04/30/2013   GLUCOSE 81 04/30/2013   ALKPHOS 71 04/30/2013   ALT 12 04/30/2013   AST 18 04/30/2013   NA 141 04/30/2013   K 4.3 04/30/2013   CL 106 04/30/2013   CREATININE 1.6* 04/30/2013   BUN 43.6* 04/30/2013   CO2 25 04/30/2013   INR 0.96 06/28/2012       ASSESSMENT AND PLAN:   1. Positive UPEP for kappa Bence Jones Protein:   - Again, most consistent with MGUS.  Her bone marrow biopsy in 06/2012 showed only 2% plasma cell.  She does not have severe anemia or hypercalcemia.  Her slight renal insufficiency is most likely due to DM, HTH.  Renal biopsy in 06/2012 showed sclerosis.  - Treatment:  There is no indication for chemotherapy in MGUS.   - Follow up:  In about 3 months at the [facility] for close follow up given slight increase in Cr.  In the future, if her Mspike, or light chain significantly increase or with worsened renal function or anemia, or hypercalcemia, we may consider repeating bone marrow biopsy.   2. Hypertension:  She is on amlodipine, Lisinopril/HCTZ.  3. Hyperlipidemia: She is on pravastatin.  4. Chronic kidney disease, stage III: from HTN per kidney biopsy.  Follow up with renal.  5. Slight normocytic anemia:  Due to CKD.  There is no active bleeding.  There is no need for transfusion.     Mrs. Case agreed with watchful observation for now.  I informed her that I am leaving the practice.  [facility] to see a new provider when she returns.     The length of time of the face-to-face encounter was 15 minutes. More than 50% of time was spent counseling and coordination  of care.

## 2013-08-02 ENCOUNTER — Other Ambulatory Visit (HOSPITAL_BASED_OUTPATIENT_CLINIC_OR_DEPARTMENT_OTHER): Payer: Medicare Other

## 2013-08-02 DIAGNOSIS — D472 Monoclonal gammopathy: Secondary | ICD-10-CM

## 2013-08-02 LAB — CBC WITH DIFFERENTIAL/PLATELET
BASO%: 0.9 % (ref 0.0–2.0)
EOS%: 3.4 % (ref 0.0–7.0)
HCT: 30 % — ABNORMAL LOW (ref 34.8–46.6)
LYMPH%: 21.1 % (ref 14.0–49.7)
MCH: 32.6 pg (ref 25.1–34.0)
MCHC: 34.4 g/dL (ref 31.5–36.0)
NEUT%: 65.9 % (ref 38.4–76.8)
Platelets: 169 10*3/uL (ref 145–400)

## 2013-08-02 LAB — COMPREHENSIVE METABOLIC PANEL (CC13)
ALT: 12 U/L (ref 0–55)
Albumin: 3.5 g/dL (ref 3.5–5.0)
Alkaline Phosphatase: 63 U/L (ref 40–150)
Glucose: 98 mg/dl (ref 70–140)
Potassium: 4.1 mEq/L (ref 3.5–5.1)
Sodium: 143 mEq/L (ref 136–145)
Total Protein: 6.8 g/dL (ref 6.4–8.3)

## 2013-08-06 LAB — SPEP & IFE WITH QIG
Alpha-1-Globulin: 7.6 % — ABNORMAL HIGH (ref 2.9–4.9)
Alpha-2-Globulin: 12.5 % — ABNORMAL HIGH (ref 7.1–11.8)
IgG (Immunoglobin G), Serum: 803 mg/dL (ref 690–1700)
IgM, Serum: 26 mg/dL — ABNORMAL LOW (ref 52–322)
Total Protein, Serum Electrophoresis: 6.2 g/dL (ref 6.0–8.3)

## 2013-08-09 ENCOUNTER — Telehealth: Payer: Self-pay | Admitting: Hematology and Oncology

## 2013-08-09 ENCOUNTER — Ambulatory Visit (HOSPITAL_BASED_OUTPATIENT_CLINIC_OR_DEPARTMENT_OTHER): Payer: Medicare Other | Admitting: Hematology and Oncology

## 2013-08-09 VITALS — BP 156/72 | HR 76 | Temp 98.9°F | Resp 18 | Ht 63.0 in | Wt 187.8 lb

## 2013-08-09 DIAGNOSIS — D631 Anemia in chronic kidney disease: Secondary | ICD-10-CM

## 2013-08-09 DIAGNOSIS — D472 Monoclonal gammopathy: Secondary | ICD-10-CM

## 2013-08-09 DIAGNOSIS — N039 Chronic nephritic syndrome with unspecified morphologic changes: Secondary | ICD-10-CM

## 2013-08-09 DIAGNOSIS — I10 Essential (primary) hypertension: Secondary | ICD-10-CM

## 2013-08-09 DIAGNOSIS — E785 Hyperlipidemia, unspecified: Secondary | ICD-10-CM

## 2013-08-09 DIAGNOSIS — E039 Hypothyroidism, unspecified: Secondary | ICD-10-CM

## 2013-08-09 NOTE — Telephone Encounter (Signed)
gv and printed appt sched and avs for pt  °

## 2013-08-12 NOTE — Progress Notes (Signed)
ID: Nadine Counts OB: 02-Jul-1929  MR#: [id number]  ION#:[id number]  PCP: DAVIS,SALLY, PA-C   DIAGNOSIS: Monoclonal gammopathy of undetermined significance (MGUS)   CURRENT THERAPY: Watchful observation.   INTERVAL HISTORY:  Jessica Byrd 79 y.o. female who returns for regular follow up visit. She is doing well. Her hearing is diminished. She has numbness of hands Intermittently after surgery. She has chronic joint pain from presumed osteoporosis. The patient denied fever, chills, night sweats, change in appetite or weight. She denied headaches, double vision, blurry vision, nasal congestion, nasal discharge, odynophagia or dysphagia. No chest pain, palpitations, dyspnea, cough, abdominal pain, nausea, vomiting, diarrhea, constipation, hematochezia. The patient denied dysuria, nocturia, polyuria, hematuria, myalgia, numbness, tingling, psychiatric problems.  Review of Systems  Constitutional: Negative for fever, chills, weight loss and malaise/fatigue.  HENT: Positive for hearing loss. Negative for nosebleeds, congestion, sore throat, neck pain, tinnitus and ear discharge.   Eyes: Negative for blurred vision, double vision, photophobia and pain.  Respiratory: Negative for cough, hemoptysis, sputum production, shortness of breath and wheezing.   Cardiovascular: Negative for chest pain, palpitations, orthopnea, claudication, leg swelling and PND.  Gastrointestinal: Negative for heartburn, nausea, vomiting, abdominal pain, diarrhea, constipation, blood in stool and melena.  Genitourinary: Negative for dysuria, urgency, frequency, hematuria and flank pain.  Musculoskeletal: Negative for myalgias, back pain and joint pain.  Skin: Negative for itching and rash.  Neurological: Positive for sensory change. Negative for dizziness, tingling, tremors, speech change, focal weakness, seizures, weakness and headaches.  Endo/Heme/Allergies: Does not bruise/bleed easily.  Psychiatric/Behavioral:  Negative.     PAST MEDICAL HISTORY: Past Medical History  Diagnosis Date  . HTN (hypertension)   . Hyperlipidemia   . Asthma   . GERD (gastroesophageal reflux disease)   . Hypothyroid   . Monoclonal gammopathies   . CKD (chronic kidney disease) stage 3, GFR 30-59 ml/min   . Borderline diabetes mellitus   . Ovarian cancer 1972    reportedly early stage; s/p TAH/BSO and adjuvant radaition.  Marland Kitchen MGUS (monoclonal gammopathy of unknown significance) 07/07/2012    PAST SURGICAL HISTORY: Past Surgical History  Procedure Laterality Date  . Total abdominal hysterectomy w/ bilateral salpingoophorectomy    . Umibical hernia repair    . Right rotator cuff surgery    . Thyroidectomy, partial    . Vocal cord repair    . Colonoscopy  2008    reportedly negative per patient.     FAMILY HISTORY Family History  Problem Relation Age of Onset  . Cancer Father 22    gastric   . Cancer Sister     lung cancer  . Cancer Brother 40    cholangiocarcinoma   HEALTH MAINTENANCE: History  Substance Use Topics  . Smoking status: Never Smoker   . Smokeless tobacco: Not on file  . Alcohol Use:     Allergies  Allergen Reactions  . Penicillins Other (See Comments)    Stiffness in hands    Current Outpatient Prescriptions  Medication Sig Dispense Refill  . amLODipine (NORVASC) 10 MG tablet Take 10 mg by mouth daily with breakfast.       . aspirin 81 MG tablet Take 81 mg by mouth every other day.      . Calcium-Vitamin D (CALTRATE 600 PLUS-VIT D PO) Take 1 capsule by mouth 2 (two) times daily.      . Glucosamine-Chondroit-Vit C-Mn (GLUCOSAMINE CHONDR 1500 COMPLX PO) Take 1 tablet by mouth daily.       Marland Kitchen levothyroxine (SYNTHROID,  LEVOTHROID) 75 MCG tablet Take 75 mcg by mouth daily before breakfast.       . lisinopril-hydrochlorothiazide (PRINZIDE,ZESTORETIC) 20-12.5 MG per tablet Take 1 tablet by mouth daily with breakfast.       . loperamide (IMODIUM A-D) 2 MG tablet Take 2 mg by mouth daily  with breakfast.       . omeprazole (PRILOSEC) 20 MG capsule Take 20 mg by mouth daily with breakfast.       . pravastatin (PRAVACHOL) 40 MG tablet Take 80 mg by mouth every evening.       . psyllium (METAMUCIL) 58.6 % powder Take 1 packet by mouth 2 (two) times daily.      Marland Kitchen albuterol (VENTOLIN HFA) 108 (90 BASE) MCG/ACT inhaler Inhale 2 puffs into the lungs every 6 (six) hours as needed. For wheezing       No current facility-administered medications for this visit.    OBJECTIVE: Filed Vitals:   08/09/13 1307  BP: 156/72  Pulse: 76  Temp: 98.9 F (37.2 C)  Resp: 18     Body mass index is 33.28 kg/(m^2).    ECOG FS: 0  HEENT: Sclerae anicteric.  Conjunctivae were pink. Pupils round and reactive bilaterally. Oral mucosa is moist without ulceration or thrush. No occipital, submandibular, cervical, supraclavicular or axillar adenopathy. Lungs: clear to auscultation without wheezes. No rales or rhonchi. Heart: regular rate and rhythm. No murmur, gallop or rubs. Abdomen: soft, non tender. No guarding or rebound tenderness. Bowel sounds are present. No palpable hepatosplenomegaly. MSK: no focal spinal tenderness. Extremities: No clubbing or cyanosis.No calf tenderness to palpitation, no peripheral edema. The patient had grossly intact strength in upper and lower extremities Neuro: non-focal, alert and oriented to time, person and place, appropriate affect  LAB RESULTS:  CMP     Component Value Date/Time   NA 143 08/02/2013 1301   NA 143 07/07/2012 1256   K 4.1 08/02/2013 1301   K 4.2 07/07/2012 1256   CL 106 04/30/2013 1305   CL 108 07/07/2012 1256   CO2 27 08/02/2013 1301   CO2 26 07/07/2012 1256   GLUCOSE 98 08/02/2013 1301   GLUCOSE 81 04/30/2013 1305   GLUCOSE 118* 07/07/2012 1256   BUN 35.1* 08/02/2013 1301   BUN 35* 07/07/2012 1256   CREATININE 1.4* 08/02/2013 1301   CREATININE 1.28* 07/07/2012 1256   CALCIUM 9.1 08/02/2013 1301   CALCIUM 9.3 07/07/2012 1256   PROT 6.8 08/02/2013 1301   PROT  6.6 07/07/2012 1256   ALBUMIN 3.5 08/02/2013 1301   ALBUMIN 4.0 07/07/2012 1256   AST 20 08/02/2013 1301   AST 19 07/07/2012 1256   ALT 12 08/02/2013 1301   ALT 12 07/07/2012 1256   ALKPHOS 63 08/02/2013 1301   ALKPHOS 62 07/07/2012 1256   BILITOT 0.39 08/02/2013 1301   BILITOT 0.4 07/07/2012 1256    I No results found for this basename: SPEP, UPEP,  kappa and lambda light chains    Lab Results  Component Value Date   WBC 7.4 08/02/2013   NEUTROABS 4.8 08/02/2013   HGB 10.3* 08/02/2013   HCT 30.0* 08/02/2013   MCV 94.5 08/02/2013   PLT 169 08/02/2013      Chemistry      Component Value Date/Time   NA 143 08/02/2013 1301   NA 143 07/07/2012 1256   K 4.1 08/02/2013 1301   K 4.2 07/07/2012 1256   CL 106 04/30/2013 1305   CL 108 07/07/2012 1256   CO2 27  08/02/2013 1301   CO2 26 07/07/2012 1256   BUN 35.1* 08/02/2013 1301   BUN 35* 07/07/2012 1256   CREATININE 1.4* 08/02/2013 1301   CREATININE 1.28* 07/07/2012 1256      Component Value Date/Time   CALCIUM 9.1 08/02/2013 1301   CALCIUM 9.3 07/07/2012 1256   ALKPHOS 63 08/02/2013 1301   ALKPHOS 62 07/07/2012 1256   AST 20 08/02/2013 1301   AST 19 07/07/2012 1256   ALT 12 08/02/2013 1301   ALT 12 07/07/2012 1256   BILITOT 0.39 08/02/2013 1301   BILITOT 0.4 07/07/2012 1256       STUDIES: No results found.  ASSESSMENT AND PLAN:  1.Monoclonal gammopathy of undetermined significance   Positive UPEP for kappa Bence Jones Protein. No UIFE/TP was done recently. Stable in last 4 month kappa/lambda ratio. M-spike not detected.  Treatment: There is no indication for chemotherapy in MGUS.  2. Chronic kidney disease, stage III: from HTN per kidney biopsy. Follow up with renal.  3. Slight normocytic anemia: Due to CKD. There is no active bleeding. There is no need for transfusion.  Mrs. Mottola agreed with watchful observation for now. - Follow up: in 3 and 6 months  CBC, UIFE/TP, M-spike and kappa/lambda ratio. Visit in 6 months.   Myra Rude, MD   08/12/2013 10:41  PM

## 2013-10-25 ENCOUNTER — Telehealth: Payer: Self-pay | Admitting: Hematology and Oncology

## 2013-10-25 NOTE — Telephone Encounter (Signed)
returned pt call to r/s lab...done

## 2013-10-29 ENCOUNTER — Other Ambulatory Visit: Payer: Medicare Other | Admitting: Lab

## 2013-10-29 ENCOUNTER — Other Ambulatory Visit: Payer: Self-pay | Admitting: Hematology and Oncology

## 2013-10-29 DIAGNOSIS — D472 Monoclonal gammopathy: Secondary | ICD-10-CM

## 2013-10-30 ENCOUNTER — Encounter (INDEPENDENT_AMBULATORY_CARE_PROVIDER_SITE_OTHER): Payer: Self-pay

## 2013-10-30 ENCOUNTER — Other Ambulatory Visit (HOSPITAL_BASED_OUTPATIENT_CLINIC_OR_DEPARTMENT_OTHER): Payer: Medicare Other | Admitting: Lab

## 2013-10-30 DIAGNOSIS — D472 Monoclonal gammopathy: Secondary | ICD-10-CM

## 2013-10-30 LAB — CBC WITH DIFFERENTIAL/PLATELET
BASO%: 0.9 % (ref 0.0–2.0)
HCT: 32.4 % — ABNORMAL LOW (ref 34.8–46.6)
LYMPH%: 22.1 % (ref 14.0–49.7)
MCH: 31.7 pg (ref 25.1–34.0)
MCHC: 33.9 g/dL (ref 31.5–36.0)
MONO#: 0.7 10*3/uL (ref 0.1–0.9)
NEUT#: 4.9 10*3/uL (ref 1.5–6.5)
Platelets: 171 10*3/uL (ref 145–400)
RBC: 3.45 10*6/uL — ABNORMAL LOW (ref 3.70–5.45)
WBC: 7.5 10*3/uL (ref 3.9–10.3)
lymph#: 1.7 10*3/uL (ref 0.9–3.3)

## 2013-10-30 LAB — COMPREHENSIVE METABOLIC PANEL (CC13)
ALT: 13 U/L (ref 0–55)
Anion Gap: 11 mEq/L (ref 3–11)
CO2: 23 mEq/L (ref 22–29)
Calcium: 9.6 mg/dL (ref 8.4–10.4)
Chloride: 108 mEq/L (ref 98–109)
Sodium: 142 mEq/L (ref 136–145)
Total Bilirubin: 0.36 mg/dL (ref 0.20–1.20)
Total Protein: 7 g/dL (ref 6.4–8.3)

## 2013-10-31 ENCOUNTER — Telehealth: Payer: Self-pay | Admitting: Hematology and Oncology

## 2013-10-31 NOTE — Telephone Encounter (Signed)
sw pt aware of 2/15 appts adv to have xray prior to appt pt wanted to have xr at [facility] bc lives in [place] SW Nurse Traci who said pt needs to have XR at [facility] Calendar of appts w note about having xray prior to appt mailed [facility]

## 2013-11-01 LAB — SPEP & IFE WITH QIG
Alpha-1-Globulin: 5.1 % — ABNORMAL HIGH (ref 2.9–4.9)
Alpha-2-Globulin: 12.5 % — ABNORMAL HIGH (ref 7.1–11.8)
Beta Globulin: 6.3 % (ref 4.7–7.2)
Gamma Globulin: 12.1 % (ref 11.1–18.8)
Total Protein, Serum Electrophoresis: 6.4 g/dL (ref 6.0–8.3)

## 2013-11-01 LAB — KAPPA/LAMBDA LIGHT CHAINS
Kappa free light chain: 31.6 mg/dL — ABNORMAL HIGH (ref 0.33–1.94)
Lambda Free Lght Chn: 3.08 mg/dL — ABNORMAL HIGH (ref 0.57–2.63)

## 2013-11-01 LAB — BETA 2 MICROGLOBULIN, SERUM: Beta-2 Microglobulin: 3.66 mg/L — ABNORMAL HIGH (ref 1.01–1.73)

## 2014-01-28 ENCOUNTER — Ambulatory Visit (HOSPITAL_COMMUNITY)
Admission: RE | Admit: 2014-01-28 | Discharge: 2014-01-28 | Disposition: A | Discharge status: Home / Self Care | Payer: Medicare HMO | Source: Ambulatory Visit | Attending: Hematology and Oncology | Admitting: Hematology and Oncology

## 2014-01-28 DIAGNOSIS — I70209 Unspecified atherosclerosis of native arteries of extremities, unspecified extremity: Secondary | ICD-10-CM | POA: Insufficient documentation

## 2014-01-28 DIAGNOSIS — D472 Monoclonal gammopathy: Secondary | ICD-10-CM | POA: Insufficient documentation

## 2014-01-28 DIAGNOSIS — M47812 Spondylosis without myelopathy or radiculopathy, cervical region: Secondary | ICD-10-CM | POA: Insufficient documentation

## 2014-01-28 DIAGNOSIS — M259 Joint disorder, unspecified: Secondary | ICD-10-CM | POA: Insufficient documentation

## 2014-01-28 DIAGNOSIS — M8448XA Pathological fracture, other site, initial encounter for fracture: Secondary | ICD-10-CM | POA: Insufficient documentation

## 2014-01-29 ENCOUNTER — Ambulatory Visit (HOSPITAL_COMMUNITY): Payer: Medicare Other

## 2014-02-07 ENCOUNTER — Ambulatory Visit: Payer: Medicare Other

## 2014-02-07 ENCOUNTER — Encounter: Payer: Self-pay | Admitting: Hematology and Oncology

## 2014-02-07 ENCOUNTER — Ambulatory Visit (HOSPITAL_BASED_OUTPATIENT_CLINIC_OR_DEPARTMENT_OTHER): Payer: Medicare HMO | Admitting: Hematology and Oncology

## 2014-02-07 ENCOUNTER — Encounter (INDEPENDENT_AMBULATORY_CARE_PROVIDER_SITE_OTHER): Payer: Self-pay

## 2014-02-07 ENCOUNTER — Other Ambulatory Visit (HOSPITAL_BASED_OUTPATIENT_CLINIC_OR_DEPARTMENT_OTHER): Payer: Medicare Other

## 2014-02-07 VITALS — BP 163/68 | HR 92 | Temp 98.3°F | Resp 18 | Ht 63.0 in | Wt 189.0 lb

## 2014-02-07 DIAGNOSIS — D649 Anemia, unspecified: Secondary | ICD-10-CM

## 2014-02-07 DIAGNOSIS — N183 Chronic kidney disease, stage 3 unspecified: Secondary | ICD-10-CM

## 2014-02-07 DIAGNOSIS — D472 Monoclonal gammopathy: Secondary | ICD-10-CM

## 2014-02-07 LAB — COMPREHENSIVE METABOLIC PANEL (CC13)
ALBUMIN: 3.8 g/dL (ref 3.5–5.0)
ALT: 14 U/L (ref 0–55)
AST: 22 U/L (ref 5–34)
Alkaline Phosphatase: 66 U/L (ref 40–150)
Anion Gap: 10 mEq/L (ref 3–11)
BUN: 30.1 mg/dL — ABNORMAL HIGH (ref 7.0–26.0)
CALCIUM: 9.5 mg/dL (ref 8.4–10.4)
CO2: 27 mEq/L (ref 22–29)
Chloride: 107 mEq/L (ref 98–109)
Creatinine: 1.2 mg/dL — ABNORMAL HIGH (ref 0.6–1.1)
GLUCOSE: 100 mg/dL (ref 70–140)
POTASSIUM: 3.8 meq/L (ref 3.5–5.1)
SODIUM: 143 meq/L (ref 136–145)
TOTAL PROTEIN: 6.6 g/dL (ref 6.4–8.3)
Total Bilirubin: 0.41 mg/dL (ref 0.20–1.20)

## 2014-02-07 LAB — CBC WITH DIFFERENTIAL/PLATELET
BASO%: 0.3 % (ref 0.0–2.0)
BASOS ABS: 0 10*3/uL (ref 0.0–0.1)
EOS ABS: 0.1 10*3/uL (ref 0.0–0.5)
EOS%: 1 % (ref 0.0–7.0)
HCT: 32.9 % — ABNORMAL LOW (ref 34.8–46.6)
HGB: 11 g/dL — ABNORMAL LOW (ref 11.6–15.9)
LYMPH%: 25.4 % (ref 14.0–49.7)
MCH: 31.1 pg (ref 25.1–34.0)
MCHC: 33.4 g/dL (ref 31.5–36.0)
MCV: 92.9 fL (ref 79.5–101.0)
MONO#: 0.6 10*3/uL (ref 0.1–0.9)
MONO%: 7.3 % (ref 0.0–14.0)
NEUT%: 66 % (ref 38.4–76.8)
NEUTROS ABS: 5.1 10*3/uL (ref 1.5–6.5)
Platelets: 176 10*3/uL (ref 145–400)
RBC: 3.54 10*6/uL — AB (ref 3.70–5.45)
RDW: 12.8 % (ref 11.2–14.5)
WBC: 7.7 10*3/uL (ref 3.9–10.3)
lymph#: 2 10*3/uL (ref 0.9–3.3)

## 2014-02-07 NOTE — Progress Notes (Signed)
[place] OFFICE PROGRESS NOTE  Patient Care Team: Adron Bene as PCP - General (Physician Assistant) Sherril Croon, MD (Nephrology) Rochel Brome as Referring Physician (Family Medicine)  DIAGNOSIS: Kappa light chain monoclonal gammopathy of unknown significance  SUMMARY OF ONCOLOGIC HISTORY: This patient was referred here because of abnormal blood work. She was noted to have monoclonal gammopathy of unknown significance, with predominant light chain secretion. In July 2013 she had bone marrow biopsy which only showed 2% plasma cells. She is being observed.  INTERVAL HISTORY: Jessica Byrd 80 y.o. female returns for further followup. She denies any recent infection. No new bone pain. She denies any recent fever, chills, night sweats or abnormal weight loss  I have reviewed the past medical history, past surgical history, social history and family history with the patient and they are unchanged from previous note.  ALLERGIES:  is allergic to penicillins.  MEDICATIONS:  Current Outpatient Prescriptions  Medication Sig Dispense Refill  . albuterol (VENTOLIN HFA) 108 (90 BASE) MCG/ACT inhaler Inhale 2 puffs into the lungs every 6 (six) hours as needed. For wheezing      . amLODipine (NORVASC) 10 MG tablet Take 10 mg by mouth daily with breakfast.       . aspirin 81 MG tablet Take 81 mg by mouth every other day.      . Calcium-Vitamin D (CALTRATE 600 PLUS-VIT D PO) Take 1 capsule by mouth 2 (two) times daily.      . Glucosamine-Chondroit-Vit C-Mn (GLUCOSAMINE CHONDR 1500 COMPLX PO) Take 1 tablet by mouth daily.       Marland Kitchen levothyroxine (SYNTHROID, LEVOTHROID) 75 MCG tablet Take 75 mcg by mouth daily before breakfast.       . lisinopril-hydrochlorothiazide (PRINZIDE,ZESTORETIC) 20-12.5 MG per tablet Take 1 tablet by mouth daily with breakfast.       . loperamide (IMODIUM A-D) 2 MG tablet Take 2 mg by mouth daily with breakfast.       . omeprazole (PRILOSEC) 20 MG capsule  Take 20 mg by mouth daily with breakfast.       . ONE TOUCH ULTRA TEST test strip       . pravastatin (PRAVACHOL) 40 MG tablet Take 80 mg by mouth every evening.       . psyllium (METAMUCIL) 58.6 % powder Take 1 packet by mouth 2 (two) times daily.       No current facility-administered medications for this visit.    REVIEW OF SYSTEMS:   Constitutional: Denies fevers, chills or abnormal weight loss Eyes: Denies blurriness of vision Ears, nose, mouth, throat, and face: Denies mucositis or sore throat Respiratory: Denies cough, dyspnea or wheezes Cardiovascular: Denies palpitation, chest discomfort or lower extremity swelling Gastrointestinal:  Denies nausea, heartburn or change in bowel habits Skin: Denies abnormal skin rashes Lymphatics: Denies new lymphadenopathy or easy bruising Neurological:Denies numbness, tingling or new weaknesses Behavioral/Psych: Mood is stable, no new changes  All other systems were reviewed with the patient and are negative.  PHYSICAL EXAMINATION: ECOG PERFORMANCE STATUS: 0 - Asymptomatic  Filed Vitals:   02/07/14 1315  BP: 163/68  Pulse: 92  Temp: 98.3 F (36.8 C)  Resp: 18   Filed Weights   02/07/14 1315  Weight: 189 lb (85.73 kg)    GENERAL:alert, no distress and comfortable SKIN: skin color, texture, turgor are normal, no rashes or significant lesions EYES: normal, Conjunctiva are pink and non-injected, sclera clear OROPHARYNX:no exudate, no erythema and lips, buccal mucosa, and tongue normal  NECK: supple, thyroid normal size, non-tender, without nodularity LYMPH:  no palpable lymphadenopathy in the cervical, axillary or inguinal LUNGS: clear to auscultation and percussion with normal breathing effort HEART: regular rate & rhythm and no murmurs and no lower extremity edema ABDOMEN:abdomen soft, non-tender and normal bowel sounds Musculoskeletal:no cyanosis of digits and no clubbing  NEURO: alert & oriented x 3 with fluent speech, no focal  motor/sensory deficits  LABORATORY DATA:  I have reviewed the data as listed    Component Value Date/Time   NA 143 02/07/2014 1257   NA 143 07/07/2012 1256   K 3.8 02/07/2014 1257   K 4.2 07/07/2012 1256   CL 106 04/30/2013 1305   CL 108 07/07/2012 1256   CO2 27 02/07/2014 1257   CO2 26 07/07/2012 1256   GLUCOSE 100 02/07/2014 1257   GLUCOSE 81 04/30/2013 1305   GLUCOSE 118* 07/07/2012 1256   BUN 30.1* 02/07/2014 1257   BUN 35* 07/07/2012 1256   CREATININE 1.2* 02/07/2014 1257   CREATININE 1.28* 07/07/2012 1256   CALCIUM 9.5 02/07/2014 1257   CALCIUM 9.3 07/07/2012 1256   PROT 6.6 02/07/2014 1257   PROT 6.6 07/07/2012 1256   ALBUMIN 3.8 02/07/2014 1257   ALBUMIN 4.0 07/07/2012 1256   AST 22 02/07/2014 1257   AST 19 07/07/2012 1256   ALT 14 02/07/2014 1257   ALT 12 07/07/2012 1256   ALKPHOS 66 02/07/2014 1257   ALKPHOS 62 07/07/2012 1256   BILITOT 0.41 02/07/2014 1257   BILITOT 0.4 07/07/2012 1256    No results found for this basename: SPEP, UPEP,  kappa and lambda light chains    Lab Results  Component Value Date   WBC 7.7 02/07/2014   NEUTROABS 5.1 02/07/2014   HGB 11.0* 02/07/2014   HCT 32.9* 02/07/2014   MCV 92.9 02/07/2014   PLT 176 02/07/2014      Chemistry      Component Value Date/Time   NA 143 02/07/2014 1257   NA 143 07/07/2012 1256   K 3.8 02/07/2014 1257   K 4.2 07/07/2012 1256   CL 106 04/30/2013 1305   CL 108 07/07/2012 1256   CO2 27 02/07/2014 1257   CO2 26 07/07/2012 1256   BUN 30.1* 02/07/2014 1257   BUN 35* 07/07/2012 1256   CREATININE 1.2* 02/07/2014 1257   CREATININE 1.28* 07/07/2012 1256      Component Value Date/Time   CALCIUM 9.5 02/07/2014 1257   CALCIUM 9.3 07/07/2012 1256   ALKPHOS 66 02/07/2014 1257   ALKPHOS 62 07/07/2012 1256   AST 22 02/07/2014 1257   AST 19 07/07/2012 1256   ALT 14 02/07/2014 1257   ALT 12 07/07/2012 1256   BILITOT 0.41 02/07/2014 1257   BILITOT 0.4 07/07/2012 1256       RADIOGRAPHIC STUDIES: Recent skeletal survey show no evidence of lytic  lesions I have personally reviewed the radiological images as listed and agreed with the findings in the report.  ASSESSMENT & PLAN:  #1 kappa light chain monoclonal gammopathy of unknown significance She has no evidence of disease progression. I recommend only yearly blood work, physical examination and x-ray #2 chronic kidney disease stage III This is unlikely due to 2 MGUS. She will continue blood pressure management by primary care provider #3 chronic anemia This is likely anemia of chronic disease. The patient denies recent history of bleeding such as epistaxis, hematuria or hematochezia. She is asymptomatic from the anemia. We will observe for now.  She does not require  transfusion now.   Orders Placed This Encounter  Procedures  . DG Bone Survey Met    Standing Status: Future     Number of Occurrences:      Standing Expiration Date: 04/09/2015    Order Specific Question:  Reason for Exam (SYMPTOM  OR DIAGNOSIS REQUIRED)    Answer:  staging myeloma    Order Specific Question:  Preferred imaging location?    Answer:  [facility]  . CBC with Differential    Standing Status: Future     Number of Occurrences:      Standing Expiration Date: 02/07/2015  . Comprehensive metabolic panel    Standing Status: Future     Number of Occurrences:      Standing Expiration Date: 02/07/2015  . SPEP & IFE with QIG    Standing Status: Future     Number of Occurrences:      Standing Expiration Date: 02/07/2015  . Kappa/lambda light chains    Standing Status: Future     Number of Occurrences:      Standing Expiration Date: 02/07/2015  . Protein Electro, 24-Hour Urine    Standing Status: Future     Number of Occurrences:      Standing Expiration Date: 02/07/2015  . IFE, Urine (with Tot Prot)    Standing Status: Future     Number of Occurrences:      Standing Expiration Date: 02/07/2015  . Beta 2 microglobuline, serum    Standing Status: Future     Number of Occurrences:      Standing  Expiration Date: 02/07/2015   All questions were answered. The patient knows to call the clinic with any problems, questions or concerns. No barriers to learning was detected. I spent 15 minutes counseling the patient face to face. The total time spent in the appointment was 20 minutes and more than 50% was on counseling and review of test results     [facility], [place], MD 02/07/2014 3:38 PM

## 2014-02-08 ENCOUNTER — Telehealth: Payer: Self-pay | Admitting: Hematology and Oncology

## 2014-02-08 LAB — BETA 2 MICROGLOBULIN, SERUM: BETA 2 MICROGLOBULIN: 3.07 mg/L — AB (ref 1.01–1.73)

## 2014-02-08 NOTE — Telephone Encounter (Signed)
s.w. pt and advised on Feb 2016 appt....mailed pt appt sched/avs adn letter

## 2014-02-11 LAB — SPEP & IFE WITH QIG
ALPHA-1-GLOBULIN: 7.5 % — AB (ref 2.9–4.9)
Albumin ELP: 57 % (ref 55.8–66.1)
Alpha-2-Globulin: 12.3 % — ABNORMAL HIGH (ref 7.1–11.8)
BETA 2: 4.7 % (ref 3.2–6.5)
Beta Globulin: 7.2 % (ref 4.7–7.2)
Gamma Globulin: 11.3 % (ref 11.1–18.8)
IGM, SERUM: 23 mg/dL — AB (ref 52–322)
IgA: 194 mg/dL (ref 69–380)
IgG (Immunoglobin G), Serum: 740 mg/dL (ref 690–1700)
TOTAL PROTEIN, SERUM ELECTROPHOR: 6.4 g/dL (ref 6.0–8.3)

## 2014-02-11 LAB — KAPPA/LAMBDA LIGHT CHAINS
KAPPA FREE LGHT CHN: 31.8 mg/dL — AB (ref 0.33–1.94)
Kappa:Lambda Ratio: 10.71 — ABNORMAL HIGH (ref 0.26–1.65)
LAMBDA FREE LGHT CHN: 2.97 mg/dL — AB (ref 0.57–2.63)

## 2014-12-12 ENCOUNTER — Ambulatory Visit (INDEPENDENT_AMBULATORY_CARE_PROVIDER_SITE_OTHER): Payer: Commercial Managed Care - HMO

## 2014-12-12 DIAGNOSIS — M2040 Other hammer toe(s) (acquired), unspecified foot: Secondary | ICD-10-CM

## 2014-12-12 DIAGNOSIS — Q828 Other specified congenital malformations of skin: Secondary | ICD-10-CM

## 2014-12-12 DIAGNOSIS — M2010 Hallux valgus (acquired), unspecified foot: Secondary | ICD-10-CM

## 2014-12-12 DIAGNOSIS — B351 Tinea unguium: Secondary | ICD-10-CM

## 2014-12-12 DIAGNOSIS — E1140 Type 2 diabetes mellitus with diabetic neuropathy, unspecified: Secondary | ICD-10-CM

## 2014-12-12 DIAGNOSIS — M79676 Pain in unspecified toe(s): Secondary | ICD-10-CM

## 2014-12-12 DIAGNOSIS — I872 Venous insufficiency (chronic) (peripheral): Secondary | ICD-10-CM

## 2014-12-12 NOTE — Progress Notes (Signed)
   Subjective:    Patient ID: Jessica Byrd, female    DOB: May 27, 1929, 80 y.o.   MRN: [id number]  HPI  PT STATED SHE IS DIABETIC BUT IS WELL CONTROLLED. RT FOOT GREAT TIP OF THE TOE HAVE CORN AND BEEN HURTING FOR 1.5 YEARS. THE TOE IS BEEN THE SAME BUT GET WORSE WHEN PUTTING PRESSURE ON IT. TRIED PADDING FOR SUPPORT AND IT HELP SOME.   Review of Systems  Musculoskeletal: Positive for gait problem.  All other systems reviewed and are negative.      Objective:   Physical Exam 80 year old white female well-developed well-nourished oriented 3 presents at this time with complaint and some distress a concerning her right foot in particular third toe right foot has a painful pre-ulcerative keratotic lesion distal clavus third right. Also has dystrophic friable criptotic nails incurvated tender both on palpation with enclosed shoe wear and ambulation. Patient does have history of diabetes non-insulin-dependent [place] was 113 this morning. Patient has been trying to pad her foot is wearing a slip on shoe with nylons properly and appropriate in appropriate shoe for her feet at this time. Lower extremity objective findings as follows vascular status is diminished with pedal pulses palpable DP +2 over 4 PT 1 over 4 thready bilateral there is significant varicosities and venous stasis-type dermatitis of both feet lower reason and ankles mild +1 edema there is history of ulceration anterior right leg and shin and ankle one of the wounds took several months to heal with trips to the wound center. She does have some immunocompromise capillary refill time 4-5 seconds all digits temperature warm to cool turgor diminished as indicated neurologically epicritic and proprioceptive sensations appear to be intact although decreased to the forefoot and Semmes Weinstein testing to forefoot digits and arch. 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 No x-rays taken at this time patient wearing a slip on pair of flats that are inadequate sized in very thin soled.       Assessment & Plan:  Assessment this time his diabetes with history peripheral neuropathy and likely some angiopathy less and that and venous insufficiency and stasis patient does have high risk for ulceration pre-ulcerative hemorrhage a keratoses distal clavus third right is debrided down to dermal level dispensed some tube foam padding to cushion the toe recommended a longer wider shoe patient would benefit from extra depth shoes we'll obtain authorization for extra-depth shoes or diabetic insoles in the future. This will help prevent ulceration and complications as patient has significant risk due to both vascular and neurologic deficits. Maintain a coming shoes such as an SAS or soft spot or easy Spirit no barefoot no flimsy shoes no slip ons her dress shoes. Reduce activities wash with soap and water and to foam padding to the third toe right follow-up within 1 month hopefully we'll obtain authorization for diabetic shoes which can help prevent future breakdown and ulceration. Patient did ask about correcting or fixing the corn permanently however I advised that would require surgery with her slow healing capacity it could be a risk leading to loss of toe or foot or leg should be a last resort at this time.  Harriet Masson DPM

## 2014-12-12 NOTE — Patient Instructions (Signed)
Diabetes and Foot Care Diabetes may cause you to have problems because of poor blood supply (circulation) to your feet and legs. This may cause the skin on your feet to become thinner, break easier, and heal more slowly. Your skin may become dry, and the skin may peel and crack. You may also have nerve damage in your legs and feet causing decreased feeling in them. You may not notice minor injuries to your feet that could lead to infections or more serious problems. Taking care of your feet is one of the most important things you can do for yourself.  HOME CARE INSTRUCTIONS  Wear shoes at all times, even in the house. Do not go barefoot. Bare feet are easily injured.  Check your feet daily for blisters, cuts, and redness. If you cannot see the bottom of your feet, use a mirror or ask someone for help.  Wash your feet with warm water (do not use hot water) and mild soap. Then pat your feet and the areas between your toes until they are completely dry. Do not soak your feet as this can dry your skin.  Apply a moisturizing lotion or petroleum jelly (that does not contain alcohol and is unscented) to the skin on your feet and to dry, brittle toenails. Do not apply lotion between your toes.  Trim your toenails straight across. Do not dig under them or around the cuticle. File the edges of your nails with an emery board or nail file.  Do not cut corns or calluses or try to remove them with medicine.  Wear clean socks or stockings every day. Make sure they are not too tight. Do not wear knee-high stockings since they may decrease blood flow to your legs.  Wear shoes that fit properly and have enough cushioning. To break in new shoes, wear them for just a few hours a day. This prevents you from injuring your feet. Always look in your shoes before you put them on to be sure there are no objects inside.  Do not cross your legs. This may decrease the blood flow to your feet.  If you find a minor scrape,  cut, or break in the skin on your feet, keep it and the skin around it clean and dry. These areas may be cleansed with mild soap and water. Do not cleanse the area with peroxide, alcohol, or iodine.  When you remove an adhesive bandage, be sure not to damage the skin around it.  If you have a wound, look at it several times a day to make sure it is healing.  Do not use heating pads or hot water bottles. They may burn your skin. If you have lost feeling in your feet or legs, you may not know it is happening until it is too late.  Make sure your health care provider performs a complete foot exam at least annually or more often if you have foot problems. Report any cuts, sores, or bruises to your health care provider immediately. SEEK MEDICAL CARE IF:   You have an injury that is not healing.  You have cuts or breaks in the skin.  You have an ingrown nail.  You notice redness on your legs or feet.  You feel burning or tingling in your legs or feet.  You have pain or cramps in your legs and feet.  Your legs or feet are numb.  Your feet always feel cold. SEEK IMMEDIATE MEDICAL CARE IF:   There is increasing redness,   swelling, or pain in or around a wound.  There is a red line that goes up your leg.  Pus is coming from a wound.  You develop a fever or as directed by your health care provider.  You notice a bad smell coming from an ulcer or wound. Document Released: 12/10/2000 Document Revised: 08/15/2013 Document Reviewed: 05/22/2013 Exit[employer] Patient Information 2015 [employer]. This information is not intended to replace advice given to you by your health care provider. Make sure you discuss any questions you have with your health care provider.  

## 2015-02-07 ENCOUNTER — Ambulatory Visit (HOSPITAL_BASED_OUTPATIENT_CLINIC_OR_DEPARTMENT_OTHER): Payer: Medicare HMO

## 2015-02-07 ENCOUNTER — Ambulatory Visit (HOSPITAL_COMMUNITY)
Admission: RE | Admit: 2015-02-07 | Discharge: 2015-02-07 | Disposition: A | Discharge status: Home / Self Care | Payer: Medicare HMO | Source: Ambulatory Visit | Attending: Hematology and Oncology | Admitting: Hematology and Oncology

## 2015-02-07 DIAGNOSIS — C9000 Multiple myeloma not having achieved remission: Secondary | ICD-10-CM | POA: Diagnosis present

## 2015-02-07 DIAGNOSIS — D472 Monoclonal gammopathy: Secondary | ICD-10-CM

## 2015-02-07 LAB — COMPREHENSIVE METABOLIC PANEL (CC13)
ALT: 12 U/L (ref 0–55)
ANION GAP: 11 meq/L (ref 3–11)
AST: 20 U/L (ref 5–34)
Albumin: 3.6 g/dL (ref 3.5–5.0)
Alkaline Phosphatase: 80 U/L (ref 40–150)
BUN: 32.6 mg/dL — AB (ref 7.0–26.0)
CALCIUM: 9.5 mg/dL (ref 8.4–10.4)
CO2: 27 mEq/L (ref 22–29)
Chloride: 106 mEq/L (ref 98–109)
Creatinine: 1.2 mg/dL — ABNORMAL HIGH (ref 0.6–1.1)
EGFR: 43 mL/min/{1.73_m2} — AB (ref >90)
GLUCOSE: 110 mg/dL (ref 70–140)
Potassium: 4.2 mEq/L (ref 3.5–5.1)
Sodium: 143 mEq/L (ref 136–145)
TOTAL PROTEIN: 6.6 g/dL (ref 6.4–8.3)
Total Bilirubin: 0.26 mg/dL (ref 0.20–1.20)

## 2015-02-07 LAB — CBC WITH DIFFERENTIAL/PLATELET
BASO%: 0.6 % (ref 0.0–2.0)
BASOS ABS: 0.1 10*3/uL (ref 0.0–0.1)
EOS ABS: 0.2 10*3/uL (ref 0.0–0.5)
EOS%: 1.9 % (ref 0.0–7.0)
HCT: 33.9 % — ABNORMAL LOW (ref 34.8–46.6)
HGB: 11 g/dL — ABNORMAL LOW (ref 11.6–15.9)
LYMPH%: 17.4 % (ref 14.0–49.7)
MCH: 30.2 pg (ref 25.1–34.0)
MCHC: 32.4 g/dL (ref 31.5–36.0)
MCV: 93.3 fL (ref 79.5–101.0)
MONO#: 0.8 10*3/uL (ref 0.1–0.9)
MONO%: 7.5 % (ref 0.0–14.0)
NEUT#: 7.4 10*3/uL — ABNORMAL HIGH (ref 1.5–6.5)
NEUT%: 72.6 % (ref 38.4–76.8)
PLATELETS: 182 10*3/uL (ref 145–400)
RBC: 3.63 10*6/uL — ABNORMAL LOW (ref 3.70–5.45)
RDW: 12.9 % (ref 11.2–14.5)
WBC: 10.2 10*3/uL (ref 3.9–10.3)
lymph#: 1.8 10*3/uL (ref 0.9–3.3)

## 2015-02-11 LAB — SPEP & IFE WITH QIG
Albumin ELP: 56.2 % (ref 55.8–66.1)
Alpha-1-Globulin: 6.3 % — ABNORMAL HIGH (ref 2.9–4.9)
Alpha-2-Globulin: 13.4 % — ABNORMAL HIGH (ref 7.1–11.8)
BETA 2: 5.7 % (ref 3.2–6.5)
Beta Globulin: 7 % (ref 4.7–7.2)
GAMMA GLOBULIN: 11.4 % (ref 11.1–18.8)
IGM, SERUM: 21 mg/dL — AB (ref 52–322)
IgA: 213 mg/dL (ref 69–380)
IgG (Immunoglobin G), Serum: 827 mg/dL (ref 690–1700)
Total Protein, Serum Electrophoresis: 6.8 g/dL (ref 6.0–8.3)

## 2015-02-11 LAB — BETA 2 MICROGLOBULIN, SERUM: Beta-2 Microglobulin: 5.03 mg/L — ABNORMAL HIGH (ref <=2.51)

## 2015-02-11 LAB — KAPPA/LAMBDA LIGHT CHAINS
KAPPA FREE LGHT CHN: 63.8 mg/dL — AB (ref 0.33–1.94)
KAPPA LAMBDA RATIO: 17.53 — AB (ref 0.26–1.65)
LAMBDA FREE LGHT CHN: 3.64 mg/dL — AB (ref 0.57–2.63)

## 2015-02-14 ENCOUNTER — Encounter: Payer: Self-pay | Admitting: Hematology and Oncology

## 2015-02-14 ENCOUNTER — Telehealth: Payer: Self-pay | Admitting: Hematology and Oncology

## 2015-02-14 ENCOUNTER — Ambulatory Visit (HOSPITAL_BASED_OUTPATIENT_CLINIC_OR_DEPARTMENT_OTHER): Payer: Medicare HMO | Admitting: Hematology and Oncology

## 2015-02-14 VITALS — BP 160/58 | HR 74 | Temp 98.4°F | Resp 18 | Ht 63.0 in | Wt 179.5 lb

## 2015-02-14 DIAGNOSIS — D631 Anemia in chronic kidney disease: Secondary | ICD-10-CM | POA: Insufficient documentation

## 2015-02-14 DIAGNOSIS — D472 Monoclonal gammopathy: Secondary | ICD-10-CM

## 2015-02-14 DIAGNOSIS — N183 Chronic kidney disease, stage 3 unspecified: Secondary | ICD-10-CM

## 2015-02-14 DIAGNOSIS — D638 Anemia in other chronic diseases classified elsewhere: Secondary | ICD-10-CM

## 2015-02-14 DIAGNOSIS — N189 Chronic kidney disease, unspecified: Secondary | ICD-10-CM

## 2015-02-14 HISTORY — DX: Anemia in other chronic diseases classified elsewhere: D63.8

## 2015-02-14 NOTE — Telephone Encounter (Signed)
Gave avs & calendar for August °

## 2015-02-15 NOTE — Assessment & Plan Note (Signed)
This is likely anemia of chronic disease. The patient denies recent history of bleeding such as epistaxis, hematuria or hematochezia. She is asymptomatic from the anemia. We will observe for now.  She does not require transfusion now.   

## 2015-02-15 NOTE — Progress Notes (Signed)
[place] OFFICE PROGRESS NOTE  Patient Care Team: Adron Bene, PA-C as PCP - General (Physician Assistant) Sherril Croon, MD (Nephrology) Rochel Brome, MD as Referring Physician (Family Medicine)  SUMMARY OF ONCOLOGIC HISTORY:  DIAGNOSIS: Kappa light chain monoclonal gammopathy of unknown significance  SUMMARY OF ONCOLOGIC HISTORY: This patient was referred here because of abnormal blood work. She was noted to have monoclonal gammopathy of unknown significance, with predominant light chain secretion. In July 2013 she had bone marrow biopsy which only showed 2% plasma cells. She is being observed.  INTERVAL HISTORY: Please see below for problem oriented charting. She feels well. Denies back pain or recent bone fractures. No recurrent infection  REVIEW OF SYSTEMS:   Constitutional: Denies fevers, chills or abnormal weight loss Eyes: Denies blurriness of vision Ears, nose, mouth, throat, and face: Denies mucositis or sore throat Respiratory: Denies cough, dyspnea or wheezes Cardiovascular: Denies palpitation, chest discomfort or lower extremity swelling Gastrointestinal:  Denies nausea, heartburn or change in bowel habits Skin: Denies abnormal skin rashes Lymphatics: Denies new lymphadenopathy or easy bruising Neurological:Denies numbness, tingling or new weaknesses Behavioral/Psych: Mood is stable, no new changes  All other systems were reviewed with the patient and are negative.  I have reviewed the past medical history, past surgical history, social history and family history with the patient and they are unchanged from previous note.  ALLERGIES:  is allergic to penicillins.  MEDICATIONS:  Current Outpatient Prescriptions  Medication Sig Dispense Refill  . amLODipine (NORVASC) 10 MG tablet Take 10 mg by mouth daily with breakfast.     . aspirin 325 MG tablet Take 325 mg by mouth daily.    . Calcium-Vitamin D (CALTRATE 600 PLUS-VIT D PO) Take 1 capsule by  mouth 2 (two) times daily.    . Glucosamine-Chondroit-Vit C-Mn (GLUCOSAMINE CHONDR 1500 COMPLX PO) Take 1 tablet by mouth daily.     Marland Kitchen levothyroxine (SYNTHROID, LEVOTHROID) 75 MCG tablet Take 75 mcg by mouth daily before breakfast.     . lisinopril-hydrochlorothiazide (PRINZIDE,ZESTORETIC) 20-12.5 MG per tablet Take 1 tablet by mouth daily with breakfast.     . loperamide (IMODIUM A-D) 2 MG tablet Take 2 mg by mouth daily with breakfast.     . omeprazole (PRILOSEC) 20 MG capsule Take 20 mg by mouth daily with breakfast.     . ONE TOUCH ULTRA TEST test strip     . pravastatin (PRAVACHOL) 40 MG tablet Take 80 mg by mouth every evening.     . psyllium (METAMUCIL) 58.6 % powder Take 1 packet by mouth 2 (two) times daily.     No current facility-administered medications for this visit.    PHYSICAL EXAMINATION: ECOG PERFORMANCE STATUS: 0 - Asymptomatic  Filed Vitals:   02/14/15 1328  BP: 160/58  Pulse: 74  Temp: 98.4 F (36.9 C)  Resp: 18   Filed Weights   02/14/15 1328  Weight: 179 lb 8 oz (81.421 kg)    GENERAL:alert, no distress and comfortable SKIN: skin color, texture, turgor are normal, no rashes or significant lesions EYES: normal, Conjunctiva are pink and non-injected, sclera clear OROPHARYNX:no exudate, no erythema and lips, buccal mucosa, and tongue normal  NECK: supple, thyroid normal size, non-tender, without nodularity LYMPH:  no palpable lymphadenopathy in the cervical, axillary or inguinal LUNGS: clear to auscultation and percussion with normal breathing effort HEART: regular rate & rhythm and no murmurs and no lower extremity edema ABDOMEN:abdomen soft, non-tender and normal bowel sounds Musculoskeletal:no cyanosis of digits and  no clubbing  NEURO: alert & oriented x 3 with fluent speech, no focal motor/sensory deficits  LABORATORY DATA:  I have reviewed the data as listed    Component Value Date/Time   NA 143 02/07/2015 1222   NA 143 07/07/2012 1256   K 4.2  02/07/2015 1222   K 4.2 07/07/2012 1256   CL 106 04/30/2013 1305   CL 108 07/07/2012 1256   CO2 27 02/07/2015 1222   CO2 26 07/07/2012 1256   GLUCOSE 110 02/07/2015 1222   GLUCOSE 81 04/30/2013 1305   GLUCOSE 118* 07/07/2012 1256   BUN 32.6* 02/07/2015 1222   BUN 35* 07/07/2012 1256   CREATININE 1.2* 02/07/2015 1222   CREATININE 1.28* 07/07/2012 1256   CALCIUM 9.5 02/07/2015 1222   CALCIUM 9.3 07/07/2012 1256   PROT 6.6 02/07/2015 1222   PROT 6.6 07/07/2012 1256   ALBUMIN 3.6 02/07/2015 1222   ALBUMIN 4.0 07/07/2012 1256   AST 20 02/07/2015 1222   AST 19 07/07/2012 1256   ALT 12 02/07/2015 1222   ALT 12 07/07/2012 1256   ALKPHOS 80 02/07/2015 1222   ALKPHOS 62 07/07/2012 1256   BILITOT 0.26 02/07/2015 1222   BILITOT 0.4 07/07/2012 1256    No results found for: SPEP, UPEP  Lab Results  Component Value Date   WBC 10.2 02/07/2015   NEUTROABS 7.4* 02/07/2015   HGB 11.0* 02/07/2015   HCT 33.9* 02/07/2015   MCV 93.3 02/07/2015   PLT 182 02/07/2015      Chemistry      Component Value Date/Time   NA 143 02/07/2015 1222   NA 143 07/07/2012 1256   K 4.2 02/07/2015 1222   K 4.2 07/07/2012 1256   CL 106 04/30/2013 1305   CL 108 07/07/2012 1256   CO2 27 02/07/2015 1222   CO2 26 07/07/2012 1256   BUN 32.6* 02/07/2015 1222   BUN 35* 07/07/2012 1256   CREATININE 1.2* 02/07/2015 1222   CREATININE 1.28* 07/07/2012 1256      Component Value Date/Time   CALCIUM 9.5 02/07/2015 1222   CALCIUM 9.3 07/07/2012 1256   ALKPHOS 80 02/07/2015 1222   ALKPHOS 62 07/07/2012 1256   AST 20 02/07/2015 1222   AST 19 07/07/2012 1256   ALT 12 02/07/2015 1222   ALT 12 07/07/2012 1256   BILITOT 0.26 02/07/2015 1222   BILITOT 0.4 07/07/2012 1256       RADIOGRAPHIC STUDIES: Skeletal survey was negative I have personally reviewed the radiological images as listed and agreed with the findings in the report.    ASSESSMENT & PLAN:  MGUS (monoclonal gammopathy of unknown  significance) I am concerned about recent worsening light chain. I am waiting for 24 hours urine collection and quantitated light chain. If the rise is significant, she may need close follow-up in 3 months. If not, the elevation could be due to CKD and she can be seen in 6 months. Clinically, she has no evidence of end organ damage and can be observed   Anemia in chronic illness This is likely anemia of chronic disease. The patient denies recent history of bleeding such as epistaxis, hematuria or hematochezia. She is asymptomatic from the anemia. We will observe for now.  She does not require transfusion now.     CKD (chronic kidney disease) stage 3, GFR 30-59 ml/min This is stable and not related to myeloma. Will observe closely    Orders Placed This Encounter  Procedures  . Comprehensive metabolic panel    Standing  Status: Future     Number of Occurrences:      Standing Expiration Date: 03/20/2016  . CBC with Differential/Platelet    Standing Status: Future     Number of Occurrences:      Standing Expiration Date: 03/20/2016  . SPEP & IFE with QIG    Standing Status: Future     Number of Occurrences:      Standing Expiration Date: 03/20/2016  . Kappa/lambda light chains    Standing Status: Future     Number of Occurrences:      Standing Expiration Date: 03/20/2016  . Beta 2 microglobulin, serum    Standing Status: Future     Number of Occurrences:      Standing Expiration Date: 03/20/2016  . Immunofixation interpretive, urine    Standing Status: Future     Number of Occurrences:      Standing Expiration Date: 03/20/2016  . Protein Electro, 24-Hour Urine    Standing Status: Future     Number of Occurrences: 1     Standing Expiration Date: 03/20/2016  . IFE, Urine (with Tot Prot)    Standing Status: Future     Number of Occurrences: 1     Standing Expiration Date: 03/20/2016   All questions were answered. The patient knows to call the clinic with any problems, questions or  concerns. No barriers to learning was detected. I spent 15 minutes counseling the patient face to face. The total time spent in the appointment was 20 minutes and more than 50% was on counseling and review of test results     [facility], Mosi Hannold, MD 02/15/2015 7:44 PM

## 2015-02-15 NOTE — Assessment & Plan Note (Signed)
I am concerned about recent worsening light chain. I am waiting for 24 hours urine collection and quantitated light chain. If the rise is significant, she may need close follow-up in 3 months. If not, the elevation could be due to CKD and she can be seen in 6 months. Clinically, she has no evidence of end organ damage and can be observed

## 2015-02-15 NOTE — Assessment & Plan Note (Signed)
This is stable and not related to myeloma. Will observe closely

## 2015-02-18 LAB — UIFE/LIGHT CHAINS/TP QN, 24-HR UR
ALBUMIN, U: DETECTED
ALPHA 1 UR: DETECTED — AB
Alpha 2, Urine: DETECTED — AB
Beta, Urine: DETECTED — AB
Gamma Globulin, Urine: DETECTED — AB
TIME-UPE24: 24 h
TOTAL PROTEIN, URINE-UPE24: 26 mg/dL — AB (ref 5–24)
TOTAL PROTEIN, URINE-UR/DAY: 234 mg/d — AB (ref <150)
Volume, Urine: 900 mL

## 2015-02-18 LAB — UPEP/TP, 24-HR URINE
ALBUMIN UR 24 HR ELECTRO: 8.6 %
ALPHA-1-GLOBULIN, U: 23.2 %
ALPHA-2-GLOBULIN, U: 16.2 %
Beta Globulin, U: 47.5 %
Collection Interval: 24 hours
Gamma Globulin, U: 4.5 %
Total Protein, Urine/Day: 234 mg/d — ABNORMAL HIGH (ref 50–100)
Total Protein, Urine: 26 mg/dL
Total Volume, Urine: 900 mL

## 2015-02-18 LAB — 24 HR URINE,KAPPA/LAMBDA LIGHT CHAINS
24H Urine Volume: 900 mL/24 h
Measured Kappa Chain: 22 mg/dL — ABNORMAL HIGH (ref <2.00)
Measured Lambda Chain: <0.4 mg/dL (ref <2.00)
TOTAL KAPPA CHAIN: 198 mg/(24.h)

## 2015-02-28 ENCOUNTER — Ambulatory Visit: Payer: Commercial Managed Care - HMO | Admitting: Neurology

## 2015-03-07 ENCOUNTER — Encounter: Payer: Self-pay | Admitting: Neurology

## 2015-03-07 ENCOUNTER — Ambulatory Visit (INDEPENDENT_AMBULATORY_CARE_PROVIDER_SITE_OTHER): Payer: Medicare HMO | Admitting: Neurology

## 2015-03-07 VITALS — BP 132/78 | HR 82 | Ht 63.0 in | Wt 178.0 lb

## 2015-03-07 DIAGNOSIS — M6250 Muscle wasting and atrophy, not elsewhere classified, unspecified site: Secondary | ICD-10-CM

## 2015-03-07 DIAGNOSIS — R269 Unspecified abnormalities of gait and mobility: Secondary | ICD-10-CM

## 2015-03-07 DIAGNOSIS — R531 Weakness: Secondary | ICD-10-CM | POA: Diagnosis not present

## 2015-03-07 NOTE — Progress Notes (Signed)
PATIENT: Jessica Byrd DOB: 02/24/1929  HISTORICAL  Jessica Byrd  Is 81 years old right-handed Caucasian female, referred by her primary care physician PA Adron Bene for evaluation of bilateral upper extremity weakness  She has history of monoclonal gammopathy of unknown clinical significance, hypertenion, hyperlipidemia, history of ovarian cancer at age 68,  had total hysterectomy, DM.  She presented with bilateral proximal arm muscle weakness, she had long-standing history of bilateral rotator cuff, limited range of motion of bilateral shoulder, had a previous right rotator cuff surgery, she fell April 07 2014,  Landed on her right shoulder, also had left fourth finger fracture,  Wear brace for a while  since that fall, she noticed increased difficulty raising both arms overhead, difficulty using her hands, worsening gait difficulty, dragging her right leg more,  Worsening urinary urgency, stress incontinence, she denies significant pain, no sensor loss,  No chewing swallowing difficulties, no double  vision   She had a history of vocal cord repair surgery, hoarse voice at baseline  REVIEW OF SYSTEMS: Full 14 system review of systems performed and notable only for  Shortness of breath, cough, wheezing, incontinence  ALLERGIES: Allergies  Allergen Reactions  . Penicillins Other (See Comments)    Stiffness in hands    HOME MEDICATIONS: Current Outpatient Prescriptions  Medication Sig Dispense Refill  . amLODipine (NORVASC) 10 MG tablet Take 10 mg by mouth daily with breakfast.     . aspirin 325 MG tablet Take 325 mg by mouth daily.    . Calcium-Vitamin D (CALTRATE 600 PLUS-VIT D PO) Take 1 capsule by mouth 2 (two) times daily.    . Glucosamine-Chondroit-Vit C-Mn (GLUCOSAMINE CHONDR 1500 COMPLX PO) Take 1 tablet by mouth daily.     Marland Kitchen levothyroxine (SYNTHROID, LEVOTHROID) 75 MCG tablet Take 75 mcg by mouth daily before breakfast.     . lisinopril-hydrochlorothiazide  (PRINZIDE,ZESTORETIC) 20-12.5 MG per tablet Take 1 tablet by mouth daily with breakfast.     . loperamide (IMODIUM A-D) 2 MG tablet Take 2 mg by mouth daily with breakfast.     . omeprazole (PRILOSEC) 20 MG capsule Take 20 mg by mouth daily with breakfast.     . ONE TOUCH ULTRA TEST test strip     . pravastatin (PRAVACHOL) 40 MG tablet Take 80 mg by mouth every evening.     . psyllium (METAMUCIL) 58.6 % powder Take 1 packet by mouth 2 (two) times daily.       PAST MEDICAL HISTORY: Past Medical History  Diagnosis Date  . HTN (hypertension)   . Hyperlipidemia   . Asthma   . GERD (gastroesophageal reflux disease)   . Hypothyroid   . Monoclonal gammopathies   . CKD (chronic kidney disease) stage 3, GFR 30-59 ml/min   . Borderline diabetes mellitus   . Ovarian cancer 1972    reportedly early stage; s/p TAH/BSO and adjuvant radaition.  Marland Kitchen MGUS (monoclonal gammopathy of unknown significance) 07/07/2012  . Diabetes mellitus without complication   . Anemia in chronic illness 02/14/2015    PAST SURGICAL HISTORY:  Past Surgical History  Procedure Laterality Date  . Total abdominal hysterectomy w/ bilateral salpingoophorectomy    . Umibical hernia repair    . Right rotator cuff surgery    . Thyroidectomy, partial    . Vocal cord repair    . Colonoscopy  2008    reportedly negative per patient.   Marland Kitchen Appendectomy    . Incontinence surgery  FAMILY HISTORY: Family History  Problem Relation Age of Onset  . Cancer Father 77    gastric   . Cancer Sister     lung cancer  . Cancer Brother 62    cholangiocarcinoma    SOCIAL HISTORY:  History   Social History  . Marital Status: Married    Spouse Name: N/A  . Number of Children: 1  . Years of Education: 12   Occupational History  . Retired    Social History Main Topics  . Smoking status: Never Smoker   . Smokeless tobacco: Never Used  . Alcohol Use: No  . Drug Use: No  . Sexual Activity: Not on file   Other Topics  Concern  . Not on file   Social History Narrative   Lives at home with her husband.   Right-handed.   1 or less cups caffeine/day.   PHYSICAL EXAM   Filed Vitals:   03/07/15 0933  BP: 132/78  Pulse: 82  Height: 5\' 3"  (1.6 m)  Weight: 178 lb (80.74 kg)    Not recorded      Body mass index is 31.54 kg/(m^2).  PHYSICAL EXAMNIATION:  Gen: NAD, conversant, well nourised, obese, well groomed                     Cardiovascular: Regular rate rhythm, no peripheral edema, warm, nontender. Eyes: Conjunctivae clear without exudates or hemorrhage Neck: Supple, no carotid bruise. Pulmonary: Clear to auscultation bilaterally   NEUROLOGICAL EXAM:  MENTAL STATUS: Speech:    Speech is normal; fluent and spontaneous with normal comprehension.  Cognition:    The patient is oriented to person, place, and time;     recent and remote memory intact;     language fluent;     normal attention, concentration,     fund of knowledge.  CRANIAL NERVES: CN II: Visual fields are full to confrontation. Fundoscopic exam is normal with sharp discs and no vascular changes. Venous pulsations are present bilaterally. Pupils are 4 mm and briskly reactive to light. Visual acuity is 20/20 bilaterally. CN III, IV, VI: extraocular movement are normal. No ptosis. CN V: Facial sensation is intact to pinprick in all 3 divisions bilaterally. Corneal responses are intact.  CN VII: Face is symmetric with normal eye closure and smile. CN VIII: Hearing is normal to rubbing fingers CN IX, X: Palate elevates symmetrically. Phonation is normal. CN XI: Head turning and shoulder shrug are intact CN XII: Tongue is midline with normal movements and no atrophy.  MOTOR:  bilateral shoulder muscle atrophy,   Shoulder abduction Shoulder external rotation Elbow flexion Elbow extension Wrist flexion Wrist extension Finger abduction Hip flexion Knee flexion Knee extension Ankle dorsi flexion Ankle plantar flexion  R  3  3  4  5  5- 5- 5- 5 5 5 5 5   L  3 minus  3 minus 4- 5 5- 5- 5- 5 5 5 5 5   Moderate bilateral abductor pollicis brevis, opponens weakness.  REFLEXES: Reflexes are 2+ and symmetric at the biceps, triceps, knees, and ankles. Plantar responses are flexor.  SENSORY: Light touch, pinprick, position sense, and vibration sense are intact in fingers and toes.  COORDINATION: Rapid alternating movements and fine finger movements are intact. There is no dysmetria on finger-to-nose and heel-knee-shin. There are no abnormal or extraneous movements.   GAIT/STANCE:  limp, wide-based, dragging her right leg Romberg is absent.   DIAGNOSTIC DATA (LABS, IMAGING, TESTING) - I reviewed patient records,  labs, notes, testing and imaging myself where available.  Lab Results  Component Value Date   WBC 10.2 02/07/2015   HGB 11.0* 02/07/2015   HCT 33.9* 02/07/2015   MCV 93.3 02/07/2015   PLT 182 02/07/2015      Component Value Date/Time   NA 143 02/07/2015 1222   NA 143 07/07/2012 1256   K 4.2 02/07/2015 1222   K 4.2 07/07/2012 1256   CL 106 04/30/2013 1305   CL 108 07/07/2012 1256   CO2 27 02/07/2015 1222   CO2 26 07/07/2012 1256   GLUCOSE 110 02/07/2015 1222   GLUCOSE 81 04/30/2013 1305   GLUCOSE 118* 07/07/2012 1256   BUN 32.6* 02/07/2015 1222   BUN 35* 07/07/2012 1256   CREATININE 1.2* 02/07/2015 1222   CREATININE 1.28* 07/07/2012 1256   CALCIUM 9.5 02/07/2015 1222   CALCIUM 9.3 07/07/2012 1256   PROT 6.6 02/07/2015 1222   PROT 6.6 07/07/2012 1256   ALBUMIN 3.6 02/07/2015 1222   ALBUMIN 4.0 07/07/2012 1256   AST 20 02/07/2015 1222   AST 19 07/07/2012 1256   ALT 12 02/07/2015 1222   ALT 12 07/07/2012 1256   ALKPHOS 80 02/07/2015 1222   ALKPHOS 62 07/07/2012 1256   BILITOT 0.26 02/07/2015 1222   BILITOT 0.4 07/07/2012 1256   ASSESSMENT AND PLAN  Dyneisha Murchison Meadors is a 81 y.o. female  With past medical history of bilateral rotator cuff, presenting with   Progressive weakness, since   She fell in April 2015, on examination, she has  Bilateral deltoid atrophy, bilateral shoulder abduction, external rotation , elbow flexion weakness.  1.  Differentiation diagnosis including bilateral C5-6 radiculopathy, versus syrinx,  2, MRI cervical spine  3, EMG nerve conduction study  Marcial Pacas, M.D. Ph.D.  [facility] [address], [place], [place] [address] Ph: [phone] Fax: [phone]

## 2015-03-21 ENCOUNTER — Ambulatory Visit
Admission: RE | Admit: 2015-03-21 | Discharge: 2015-03-21 | Disposition: A | Discharge status: Home / Self Care | Payer: Medicare HMO | Source: Ambulatory Visit | Attending: Neurology | Admitting: Neurology

## 2015-03-21 DIAGNOSIS — R269 Unspecified abnormalities of gait and mobility: Secondary | ICD-10-CM

## 2015-03-21 DIAGNOSIS — R531 Weakness: Secondary | ICD-10-CM

## 2015-03-21 DIAGNOSIS — M6250 Muscle wasting and atrophy, not elsewhere classified, unspecified site: Secondary | ICD-10-CM

## 2015-04-01 ENCOUNTER — Telehealth: Payer: Self-pay | Admitting: Neurology

## 2015-04-01 NOTE — Telephone Encounter (Signed)
I will go over her MRI findings at April 6  Abnormal MRI cervical spine (without) demonstrating: 1. Multi-level degenerative changes as noted above from C2-3 to C7-T1. Findings are most severe at C5-6 and C6-7 with mild spinal stenosis and severe biforaminal stenosis. No cord signal changes are noted. 2. Degenerative pannus formation posterior to the odontoid process, which minimally contacts the ventral upper cervical spinal cord at C2 level.  3. Partial bone fusion of C4-5 on degenerative basis. 4. Findings are similar to CT cervical spine from 04/07/14.

## 2015-04-02 ENCOUNTER — Ambulatory Visit (INDEPENDENT_AMBULATORY_CARE_PROVIDER_SITE_OTHER): Payer: Medicare HMO | Admitting: Neurology

## 2015-04-02 DIAGNOSIS — R2689 Other abnormalities of gait and mobility: Secondary | ICD-10-CM | POA: Diagnosis not present

## 2015-04-02 DIAGNOSIS — R269 Unspecified abnormalities of gait and mobility: Secondary | ICD-10-CM | POA: Insufficient documentation

## 2015-04-02 DIAGNOSIS — R531 Weakness: Secondary | ICD-10-CM | POA: Insufficient documentation

## 2015-04-02 DIAGNOSIS — M6259 Muscle wasting and atrophy, not elsewhere classified, multiple sites: Secondary | ICD-10-CM

## 2015-04-02 DIAGNOSIS — M6250 Muscle wasting and atrophy, not elsewhere classified, unspecified site: Secondary | ICD-10-CM | POA: Diagnosis not present

## 2015-04-02 DIAGNOSIS — G1221 Amyotrophic lateral sclerosis: Secondary | ICD-10-CM | POA: Insufficient documentation

## 2015-04-02 NOTE — Progress Notes (Signed)
PATIENT: Jessica Byrd DOB: [birth year]  HISTORICAL  Jessica Byrd  Is 81 years old right-handed Caucasian female, referred by her primary care physician PA Adron Bene for evaluation of bilateral upper extremity weakness  She has history of monoclonal gammopathy of unknown clinical significance, hypertenion, hyperlipidemia, history of ovarian cancer at age 89,  had total hysterectomy, DM.  She presented with bilateral proximal arm muscle weakness, she had long-standing history of bilateral rotator cuff, limited range of motion of bilateral shoulder, had a previous right rotator cuff surgery, she fell April 07 2014,  Landed on her right shoulder, also had left fourth finger fracture,  Wear brace for a while  since that fall, she noticed increased difficulty raising both arms overhead, difficulty using her hands, worsening gait difficulty, dragging her right leg more,  Worsening urinary urgency, stress incontinence, she denies significant pain, no sensor loss,  No chewing swallowing difficulties, no double  vision   She had a history of vocal cord repair surgery, hoarse voice at baseline  UPDATE April 6th 2016: I have reviewed MRI film with patient and her son  1. Multi-level degenerative changes as noted above from C2-3 to C7-T1. Findings are most severe at C5-6 and C6-7 with mild spinal stenosis and severe biforaminal stenosis. No cord signal changes are noted. 2. Degenerative pannus formation posterior to the odontoid process, which minimally contacts the ventral upper cervical spinal cord at C2 level.  3. Partial bone fusion of C4-5 on degenerative basis.  She returned for electrodiagnostic study today, which has demonstrated chronic right cervical, right lumbosacral neuropathic changes, there was no active denervation along right cervical, thoracic, lumbar sacral paraspinal muscles.  REVIEW OF SYSTEMS: Full 14 system review of systems performed and notable only for  Shortness  of breath, cough, wheezing, incontinence  ALLERGIES: Allergies  Allergen Reactions  . Penicillins Other (See Comments)    Stiffness in hands    HOME MEDICATIONS: Current Outpatient Prescriptions  Medication Sig Dispense Refill  . amLODipine (NORVASC) 10 MG tablet Take 10 mg by mouth daily with breakfast.     . aspirin 325 MG tablet Take 325 mg by mouth daily.    . Calcium-Vitamin D (CALTRATE 600 PLUS-VIT D PO) Take 1 capsule by mouth 2 (two) times daily.    . Glucosamine-Chondroit-Vit C-Mn (GLUCOSAMINE CHONDR 1500 COMPLX PO) Take 1 tablet by mouth daily.     Marland Kitchen levothyroxine (SYNTHROID, LEVOTHROID) 75 MCG tablet Take 75 mcg by mouth daily before breakfast.     . lisinopril-hydrochlorothiazide (PRINZIDE,ZESTORETIC) 20-12.5 MG per tablet Take 1 tablet by mouth daily with breakfast.     . loperamide (IMODIUM A-D) 2 MG tablet Take 2 mg by mouth daily with breakfast.     . omeprazole (PRILOSEC) 20 MG capsule Take 20 mg by mouth daily with breakfast.     . ONE TOUCH ULTRA TEST test strip     . pravastatin (PRAVACHOL) 40 MG tablet Take 80 mg by mouth every evening.     . psyllium (METAMUCIL) 58.6 % powder Take 1 packet by mouth 2 (two) times daily.       PAST MEDICAL HISTORY: Past Medical History  Diagnosis Date  . HTN (hypertension)   . Hyperlipidemia   . Asthma   . GERD (gastroesophageal reflux disease)   . Hypothyroid   . Monoclonal gammopathies   . CKD (chronic kidney disease) stage 3, GFR 30-59 ml/min   . Borderline diabetes mellitus   . Ovarian cancer 1972  reportedly early stage; s/p TAH/BSO and adjuvant radaition.  Marland Kitchen MGUS (monoclonal gammopathy of unknown significance) 07/07/2012  . Diabetes mellitus without complication   . Anemia in chronic illness 02/14/2015    PAST SURGICAL HISTORY:  Past Surgical History  Procedure Laterality Date  . Total abdominal hysterectomy w/ bilateral salpingoophorectomy    . Umibical hernia repair    . Right rotator cuff surgery    .  Thyroidectomy, partial    . Vocal cord repair    . Colonoscopy  2008    reportedly negative per patient.   Marland Kitchen Appendectomy    . Incontinence surgery      FAMILY HISTORY: Family History  Problem Relation Age of Onset  . Cancer Father 98    gastric   . Cancer Sister     lung cancer  . Cancer Brother 68    cholangiocarcinoma    SOCIAL HISTORY:  History   Social History  . Marital Status: Married    Spouse Name: N/A  . Number of Children: 1  . Years of Education: 12   Occupational History  . Retired    Social History Main Topics  . Smoking status: Never Smoker   . Smokeless tobacco: Never Used  . Alcohol Use: No  . Drug Use: No  . Sexual Activity: Not on file   Other Topics Concern  . Not on file   Social History Narrative   Lives at home with her husband.   Right-handed.   1 or less cups caffeine/day.   PHYSICAL EXAM   There were no vitals filed for this visit.  Not recorded      There is no weight on file to calculate BMI.  PHYSICAL EXAMNIATION:  Gen: NAD, conversant, well nourised, obese, well groomed                     Cardiovascular: Regular rate rhythm, no peripheral edema, warm, nontender. Eyes: Conjunctivae clear without exudates or hemorrhage Neck: Supple, no carotid bruise. Pulmonary: Clear to auscultation bilaterally   NEUROLOGICAL EXAM:  MENTAL STATUS: Speech:    Speech is normal; fluent and spontaneous with normal comprehension.  Cognition:    The patient is oriented to person, place, and time;     recent and remote memory intact;     language fluent;     normal attention, concentration,     fund of knowledge.  CRANIAL NERVES: CN II: Visual fields are full to confrontation. Fundoscopic exam is normal with sharp discs and no vascular changes. Venous pulsations are present bilaterally. Pupils are 4 mm and briskly reactive to light. Visual acuity is 20/20 bilaterally. CN III, IV, VI: extraocular movement are normal. No ptosis. CN V:  Facial sensation is intact to pinprick in all 3 divisions bilaterally. Corneal responses are intact.  CN VII: Face is symmetric with normal eye closure and smile. CN VIII: Hearing is normal to rubbing fingers CN IX, X: Palate elevates symmetrically. Phonation is normal. CN XI: Head turning and shoulder shrug are intact CN XII: Tongue is midline with normal movements and no atrophy.  MOTOR:  bilateral shoulder muscle atrophy,   Shoulder abduction Shoulder external rotation Elbow flexion Elbow extension Wrist flexion Wrist extension Finger abduction Hip flexion Knee flexion Knee extension Ankle dorsi flexion Ankle plantar flexion  R  3  3  4 5  5- 5- 5- 5 5 5 5 5   L  3 minus  3 minus 4- 5 5- 5- 5- 5 5 5  5 5  Moderate bilateral abductor pollicis brevis, opponens weakness.  REFLEXES: Reflexes are 2+ and symmetric at the biceps, triceps, knees, and ankles. Plantar responses are flexor.  SENSORY: Light touch, pinprick, position sense, and vibration sense are intact in fingers and toes.  COORDINATION: Rapid alternating movements and fine finger movements are intact. There is no dysmetria on finger-to-nose and heel-knee-shin. There are no abnormal or extraneous movements.   GAIT/STANCE:  limp, wide-based, dragging her right leg Romberg is absent.   DIAGNOSTIC DATA (LABS, IMAGING, TESTING) - I reviewed patient records, labs, notes, testing and imaging myself where available.  Lab Results  Component Value Date   WBC 10.2 02/07/2015   HGB 11.0* 02/07/2015   HCT 33.9* 02/07/2015   MCV 93.3 02/07/2015   PLT 182 02/07/2015      Component Value Date/Time   NA 143 02/07/2015 1222   NA 143 07/07/2012 1256   K 4.2 02/07/2015 1222   K 4.2 07/07/2012 1256   CL 106 04/30/2013 1305   CL 108 07/07/2012 1256   CO2 27 02/07/2015 1222   CO2 26 07/07/2012 1256   GLUCOSE 110 02/07/2015 1222   GLUCOSE 81 04/30/2013 1305   GLUCOSE 118* 07/07/2012 1256   BUN 32.6* 02/07/2015 1222   BUN 35*  07/07/2012 1256   CREATININE 1.2* 02/07/2015 1222   CREATININE 1.28* 07/07/2012 1256   CALCIUM 9.5 02/07/2015 1222   CALCIUM 9.3 07/07/2012 1256   PROT 6.6 02/07/2015 1222   PROT 6.6 07/07/2012 1256   ALBUMIN 3.6 02/07/2015 1222   ALBUMIN 4.0 07/07/2012 1256   AST 20 02/07/2015 1222   AST 19 07/07/2012 1256   ALT 12 02/07/2015 1222   ALT 12 07/07/2012 1256   ALKPHOS 80 02/07/2015 1222   ALKPHOS 62 07/07/2012 1256   BILITOT 0.26 02/07/2015 1222   BILITOT 0.4 07/07/2012 1256   ASSESSMENT AND PLAN  Jessica Byrd is a 81 y.o. female  With past medical history of bilateral rotator cuff, presenting with   Progressive weakness, since  She fell in April 2015, on examination, she has  Bilateral deltoid atrophy, bilateral shoulder abduction, external rotation , elbow flexion weakness.  1.  Differentiation diagnosis includes bilateral cervical, lumbar sacral radiculopathy, paraneoplastic syndrome, nutritional deficiency, even motor neuron disease, 2, MRI of lumbar 3 return to clinic in 2 weeks  Marcial Pacas, M.D. Ph.D.  [facility] [address], [place], [place] [address] Ph: [phone] Fax: [phone]

## 2015-04-02 NOTE — Procedures (Signed)
   NCS (NERVE CONDUCTION STUDY) WITH EMG (ELECTROMYOGRAPHY) REPORT   STUDY DATE: April 02 2015 PATIENT NAME: Jessica Byrd DOB: 1929/05/22 MRN: [id number]    TECHNOLOGIST: Laretta Alstrom ELECTROMYOGRAPHER: Marcial Pacas M.D.  CLINICAL INFORMATION: 81 years old female, fell in April 2015, previous history of bilateral rotator cuff surgery, now presenting with proximal arm muscle weakness, also noticed gait difficulty,   FINDINGS: NERVE CONDUCTION STUDY: Bilateral median, ulnar sensory responses were normal. Bilateral ulnar motor responses were normal. Bilateral median motor responses showed severely decreased C map amplitude, with normal distal latency, normal range conduction velocity.   NEEDLE ELECTROMYOGRAPHY: Selected needle examination was performed at right upper extremity muscles, right cervical paraspinal muscles, right lower extremity muscles, right lumbosacral paraspinal muscles, and right thoracic paraspinal muscles  Right biceps, triceps, deltoid, extensor digital communis, pronator teres, first dorsal interossei, increased insertional activity, 1 plus spontaneous activity, enlarged complex motor unit potential with decreased recruitment patterns  There was no spontaneous activity at right cervical paraspinal muscles, right C5-6 and 7 Right tibialis anterior, tibialis posterior, vastus lateralis, peroneal longus, increased insertional activity, no spontaneous activity, complex motor unit potential, With decreased recruitment patterns There was no spontaneous activity at right lumbar sacral paraspinal muscles, right L4, L5, S1.  There was no spontaneous activity at right thoracic paraspinal muscles, right T10, T11   IMPRESSION:   This is a marked abnormal study. There is electrodiagnostic evidence of chronic neuropathic changes involving right cervical, and right lumbosacral myotomes. Differentiation diagnosis includes right cervical radiculopathy, right lumbosacral  radiculopathy, nutritional deficiency, paraneoplastic syndrome, central nervous system degenerative disorder, I have planned for MRI of lumbar spine    INTERPRETING PHYSICIAN:   Marcial Pacas M.D. Ph.D. [facility] [address], [place], [place] [address] [phone]

## 2015-04-08 ENCOUNTER — Telehealth: Payer: Self-pay | Admitting: Neurology

## 2015-04-08 ENCOUNTER — Encounter: Payer: Medicare HMO | Admitting: Neurology

## 2015-04-08 NOTE — Telephone Encounter (Signed)
-----   Message from [place] sent at 04/08/2015  1:28 PM EDT ----- Glorianne Manchester, NP is calling from [facility] and needs to speak with you regarding Jessica Byrd.  Please give her a call at [phone].

## 2015-04-08 NOTE — Telephone Encounter (Signed)
I have called PCP Rochel Brome, MD, updated patients, abnormal MRI of the cervical, multilevel degenerative disc disease, severe foraminal stenosis at C5-6, C6 and 7, no significant canal stenosis,  Abnormal EMG nerve conduction study showed evidence of chronic active denervation seen involving right cervical, right lumbosacral myotomes, MRI lumbar is planned in April 20 second 2016  Michelle: Please make sure, she will have a follow-up visit 2-3 days aft MRI lumbar

## 2015-04-09 NOTE — Telephone Encounter (Signed)
MRI appt scheduled on 04/18/15 (Friday) - offered her the following Monday but she need to wait until 04/22/15 - scheduled for 8:30pm.

## 2015-04-18 ENCOUNTER — Ambulatory Visit
Admission: RE | Admit: 2015-04-18 | Discharge: 2015-04-18 | Disposition: A | Discharge status: Home / Self Care | Payer: Medicare HMO | Source: Ambulatory Visit | Attending: Neurology | Admitting: Neurology

## 2015-04-18 ENCOUNTER — Ambulatory Visit: Payer: Medicare HMO | Admitting: Neurology

## 2015-04-18 DIAGNOSIS — R531 Weakness: Secondary | ICD-10-CM

## 2015-04-18 DIAGNOSIS — M6250 Muscle wasting and atrophy, not elsewhere classified, unspecified site: Secondary | ICD-10-CM

## 2015-04-18 DIAGNOSIS — R269 Unspecified abnormalities of gait and mobility: Secondary | ICD-10-CM | POA: Diagnosis not present

## 2015-04-22 ENCOUNTER — Encounter: Payer: Self-pay | Admitting: Neurology

## 2015-04-22 ENCOUNTER — Ambulatory Visit (INDEPENDENT_AMBULATORY_CARE_PROVIDER_SITE_OTHER): Payer: Medicare HMO | Admitting: Neurology

## 2015-04-22 ENCOUNTER — Telehealth: Payer: Self-pay | Admitting: Neurology

## 2015-04-22 VITALS — BP 132/58 | HR 68 | Ht 63.0 in | Wt 178.0 lb

## 2015-04-22 DIAGNOSIS — R531 Weakness: Secondary | ICD-10-CM | POA: Diagnosis not present

## 2015-04-22 DIAGNOSIS — M4802 Spinal stenosis, cervical region: Secondary | ICD-10-CM | POA: Diagnosis not present

## 2015-04-22 DIAGNOSIS — M4806 Spinal stenosis, lumbar region: Secondary | ICD-10-CM | POA: Diagnosis not present

## 2015-04-22 DIAGNOSIS — R269 Unspecified abnormalities of gait and mobility: Secondary | ICD-10-CM | POA: Diagnosis not present

## 2015-04-22 DIAGNOSIS — M48061 Spinal stenosis, lumbar region without neurogenic claudication: Secondary | ICD-10-CM

## 2015-04-22 NOTE — Telephone Encounter (Signed)
Will review at her follow up visit today    This is an abnormal MRI of the lumbar spine showing multilevel severe degenerative changes at a detailed above. The most significant findings are: 1. Mild spinal stenosis at L3 L4 due to disc protrusion, endplate spurring, facet hypertrophy and ligamentum flavum hypertrophy. There is probable right L4 and there is some encroachment on the left L4 and both L3 nerve roots at this level. 2. Moderately severe spinal stenosis at L4-L5 due to anterolisthesis, disc protrusion, facet hypertrophy and ligament of flavum hypertrophy. She'll for compression of either the traversing L5 nerve roots at this level. 3. There is less potential for nerve root compression at the other lumbar levels. 4. There is a chronic 20% compression fracture of the L2 vertebral body. This was subacute on the 05/07/2008 MRI. 5. Compared to 05/07/2008, there has been some progression of the degenerative changes at L3-L4 and L4-L5

## 2015-04-22 NOTE — Progress Notes (Signed)
PATIENT: Jessica Byrd DOB: 11/24/1929  HISTORICAL  Jessica Byrd  Is 81 years old right-handed Caucasian female, referred by her primary care physician PA Jessica Byrd for evaluation of bilateral upper extremity weakness  She has history of monoclonal gammopathy of unknown clinical significance, hypertenion, hyperlipidemia, history of ovarian cancer at age 85,  had total hysterectomy, DM.  She presented with bilateral proximal arm muscle weakness, she had long-standing history of bilateral rotator cuff, limited range of motion of bilateral shoulder, had a previous right rotator cuff surgery, she fell April 07 2014,  Landed on her right shoulder, also had left fourth finger fracture,  Wear brace for a while  since that fall, she noticed increased difficulty raising both arms overhead, difficulty using her hands, worsening gait difficulty, dragging her right leg more,  Worsening urinary urgency, stress incontinence, she denies significant pain, no sensor loss,  No chewing swallowing difficulties, no double  vision   She had a history of vocal cord repair surgery, hoarse voice at baseline  UPDATE April 6th 2016: I have reviewed MRI film with patient and her son  1. Multi-level degenerative changes as noted above from C2-3 to C7-T1. Findings are most severe at C5-6 and C6-7 with mild spinal stenosis and severe biforaminal stenosis. No cord signal changes are noted. 2. Degenerative pannus formation posterior to the odontoid process, which minimally contacts the ventral upper cervical spinal cord at C2 level.  3. Partial bone fusion of C4-5 on degenerative basis.  She returned for electrodiagnostic study today, which has demonstrated chronic right cervical, right lumbosacral neuropathic changes, there was no active denervation along right cervical, thoracic, lumbar sacral paraspinal muscles.  UPDATE April 26th 2016: She is with her son, she denies significant pain, arms are weaker,  left worse than right. I have reviewed MRI cervical   1. Multi-level degenerative changes as noted above from C2-3 to C7-T1. Findings are most severe at C5-6 and C6-7 with mild spinal stenosis and severe biforaminal stenosis. No cord signal changes are noted. 2. Degenerative pannus formation posterior to the odontoid process, which minimally contacts the ventral upper cervical spinal cord at C2 level.  3. Partial bone fusion of C4-5 on degenerative basis. 4. Findings are similar to CT cervical spine from 04/07/14.   MRI of the lumbar spine showing multilevel severe degenerative changes at a detailed above. The most significant findings are: 1. Mild spinal stenosis at L3 L4 due to disc protrusion, endplate spurring, facet hypertrophy and ligamentum flavum hypertrophy. There is probable right L4 and there is some encroachment on the left L4 and both L3 nerve roots at this level. 2. Moderately severe spinal stenosis at L4-L5 due to anterolisthesis, disc protrusion, facet hypertrophy and ligament of flavum hypertrophy. She'll for compression of either the traversing L5 nerve roots at this level. 3. There is less potential for nerve root compression at the other lumbar levels. 4.There is a chronic 20% compression fracture of the L2 vertebral body. This was subacute on the 05/07/2008 MRI.5.Compared to 05/07/2008, there has been some progression of the degenerative changes at L3-L4 and L4-L5    REVIEW OF SYSTEMS: Full 14 system review of systems performed and notable only for  As above. ALLERGIES: Allergies  Allergen Reactions  . Penicillins Other (See Comments)    Stiffness in hands    HOME MEDICATIONS: Current Outpatient Prescriptions  Medication Sig Dispense Refill  . amLODipine (NORVASC) 10 MG tablet Take 10 mg by mouth daily with breakfast.     .  aspirin 325 MG tablet Take 325 mg by mouth daily.    . Calcium-Vitamin D (CALTRATE 600 PLUS-VIT D PO) Take 1 capsule by mouth 2 (two)  times daily.    . Glucosamine-Chondroit-Vit C-Mn (GLUCOSAMINE CHONDR 1500 COMPLX PO) Take 1 tablet by mouth daily.     Marland Kitchen levothyroxine (SYNTHROID, LEVOTHROID) 75 MCG tablet Take 75 mcg by mouth daily before breakfast.     . lisinopril-hydrochlorothiazide (PRINZIDE,ZESTORETIC) 20-12.5 MG per tablet Take 1 tablet by mouth daily with breakfast.     . loperamide (IMODIUM A-D) 2 MG tablet Take 2 mg by mouth daily with breakfast.     . omeprazole (PRILOSEC) 20 MG capsule Take 20 mg by mouth daily with breakfast.     . ONE TOUCH ULTRA TEST test strip     . pravastatin (PRAVACHOL) 40 MG tablet Take 80 mg by mouth every evening.     . psyllium (METAMUCIL) 58.6 % powder Take 1 packet by mouth 2 (two) times daily.       PAST MEDICAL HISTORY: Past Medical History  Diagnosis Date  . HTN (hypertension)   . Hyperlipidemia   . Asthma   . GERD (gastroesophageal reflux disease)   . Hypothyroid   . Monoclonal gammopathies   . CKD (chronic kidney disease) stage 3, GFR 30-59 ml/min   . Borderline diabetes mellitus   . Ovarian cancer 1972    reportedly early stage; s/p TAH/BSO and adjuvant radaition.  Marland Kitchen MGUS (monoclonal gammopathy of unknown significance) 07/07/2012  . Diabetes mellitus without complication   . Anemia in chronic illness 02/14/2015    PAST SURGICAL HISTORY:  Past Surgical History  Procedure Laterality Date  . Total abdominal hysterectomy w/ bilateral salpingoophorectomy    . Umibical hernia repair    . Right rotator cuff surgery    . Thyroidectomy, partial    . Vocal cord repair    . Colonoscopy  2008    reportedly negative per patient.   Marland Kitchen Appendectomy    . Incontinence surgery      FAMILY HISTORY: Family History  Problem Relation Age of Onset  . Cancer Father 14    gastric   . Cancer Sister     lung cancer  . Cancer Brother 58    cholangiocarcinoma    SOCIAL HISTORY:  History   Social History  . Marital Status: Married    Spouse Name: N/A  . Number of Children:  1  . Years of Education: 12   Occupational History  . Retired    Social History Main Topics  . Smoking status: Never Smoker   . Smokeless tobacco: Never Used  . Alcohol Use: No  . Drug Use: No  . Sexual Activity: Not on file   Other Topics Concern  . Not on file   Social History Narrative   Lives at home with her husband.   Right-handed.   1 or less cups caffeine/day.   PHYSICAL EXAM   Filed Vitals:   04/22/15 1349  BP: 132/58  Pulse: 68  Height: 5\' 3"  (1.6 m)  Weight: 178 lb (80.74 kg)    Not recorded      Body mass index is 31.54 kg/(m^2).  PHYSICAL EXAMNIATION:  Gen: NAD, conversant, well nourised, obese, well groomed                     Cardiovascular: Regular rate rhythm, no peripheral edema, warm, nontender. Eyes: Conjunctivae clear without exudates or hemorrhage Neck: Supple, no carotid bruise.  Pulmonary: Clear to auscultation bilaterally   NEUROLOGICAL EXAM:  MENTAL STATUS: Speech:    Speech is normal; fluent and spontaneous with normal comprehension.  Cognition:    The patient is oriented to person, place, and time;     recent and remote memory intact;     language fluent;     normal attention, concentration,     fund of knowledge.  CRANIAL NERVES: CN II: Visual fields are full to confrontation. Fundoscopic exam is normal with sharp discs and no vascular changes. Venous pulsations are present bilaterally. Pupils are 4 mm and briskly reactive to light. Visual acuity is 20/20 bilaterally. CN III, IV, VI: extraocular movement are normal. No ptosis. CN V: Facial sensation is intact to pinprick in all 3 divisions bilaterally. Corneal responses are intact.  CN VII: Face is symmetric with normal eye closure and smile. CN VIII: Hearing is normal to rubbing fingers CN IX, X: Palate elevates symmetrically. Phonation is normal. CN XI: Head turning and shoulder shrug are intact CN XII: Tongue is midline with normal movements and no atrophy.  MOTOR:   bilateral shoulder muscle atrophy,   Shoulder abduction Shoulder external rotation Elbow flexion Elbow extension Wrist flexion Wrist extension Finger abduction Hip flexion Knee flexion Knee extension Ankle dorsi flexion Ankle plantar flexion  R  3  3  4 5  5- 5- 5- 5 5 5  4- 5  L  3 minus  3 minus 4- 5 5- 5- 5- 5 5 5 4 5   Moderate bilateral abductor pollicis brevis, opponens weakness.  REFLEXES: Reflexes are 2+ and symmetric at the biceps, triceps, knees, and ankles. Plantar responses are flexor.  SENSORY: Light touch, pinprick, position sense, and vibration sense are intact in fingers and toes.  COORDINATION: Rapid alternating movements and fine finger movements are intact. There is no dysmetria on finger-to-nose and heel-knee-shin. There are no abnormal or extraneous movements.   GAIT/STANCE:  limp, wide-based, pelvic thrust, dragging her right leg Romberg is absent.   DIAGNOSTIC DATA (LABS, IMAGING, TESTING) - I reviewed patient records, labs, notes, testing and imaging myself where available.  Lab Results  Component Value Date   WBC 10.2 02/07/2015   HGB 11.0* 02/07/2015   HCT 33.9* 02/07/2015   MCV 93.3 02/07/2015   PLT 182 02/07/2015      Component Value Date/Time   NA 143 02/07/2015 1222   NA 143 07/07/2012 1256   K 4.2 02/07/2015 1222   K 4.2 07/07/2012 1256   CL 106 04/30/2013 1305   CL 108 07/07/2012 1256   CO2 27 02/07/2015 1222   CO2 26 07/07/2012 1256   GLUCOSE 110 02/07/2015 1222   GLUCOSE 81 04/30/2013 1305   GLUCOSE 118* 07/07/2012 1256   BUN 32.6* 02/07/2015 1222   BUN 35* 07/07/2012 1256   CREATININE 1.2* 02/07/2015 1222   CREATININE 1.28* 07/07/2012 1256   CALCIUM 9.5 02/07/2015 1222   CALCIUM 9.3 07/07/2012 1256   PROT 6.6 02/07/2015 1222   PROT 6.6 07/07/2012 1256   ALBUMIN 3.6 02/07/2015 1222   ALBUMIN 4.0 07/07/2012 1256   AST 20 02/07/2015 1222   AST 19 07/07/2012 1256   ALT 12 02/07/2015 1222   ALT 12 07/07/2012 1256   ALKPHOS 80  02/07/2015 1222   ALKPHOS 62 07/07/2012 1256   BILITOT 0.26 02/07/2015 1222   BILITOT 0.4 07/07/2012 1256   ASSESSMENT AND PLAN  Jessica Byrd is a 81 y.o. female  With past medical history of bilateral rotator cuff, presenting with  Progressive weakness, since  She fell in April 2015, on examination, she has  Bilateral deltoid atrophy, bilateral shoulder abduction, external rotation , elbow flexion weakness, bilateral ankle dorsiflexion weakness, gait difficulty, hyperreflexia, significant abnormality on MRI of cervical spine, and MRI lumbar spine detailed above,  Her symptoms could due to spondylitic cervical myelopathy, cervical radiculopathy, bilateral lumbar radiculopathies  She has chronic neuropathic changes at right upper and lower extremity EMG, progressive worsening course, urinary incontinence,  Jessica refer her to neurosurgeon for evaluation, possible decompression of cervical, lumbar stenosis     Marcial Pacas, M.D. Ph.D.  [facility] [address], [place], [place] [address] Ph: [phone] Fax: [phone]

## 2015-05-27 ENCOUNTER — Telehealth: Payer: Self-pay | Admitting: Neurology

## 2015-05-27 NOTE — Telephone Encounter (Signed)
I have talked with her neurosurgeon Dr. Saintclair Halsted, patient does has significant lumbar stenosis, but no significant cervical canal stenosis, no cord signal change, she does has bilateral C5-6, C6-7 foraminal stenosis, potential impingement of the exiting bilateral C6, C5 nerve roots, Dr. Saintclair Halsted worries that above findings would not explain her profound proximal bilateral upper extremity weakness, suggestive of continued observation.  Previous EMG/NCS in April 2016 showed widespread chronic right cervical, lumbar radiculopathies, well-preserved bilateral upper extremity sensory responses ruled out the possibility of bilateral cervical plexopathy, she did not complains of significant pain upon presentation.   I will continue to follow up with her at my clinic.

## 2015-07-01 ENCOUNTER — Other Ambulatory Visit: Payer: Self-pay | Admitting: Family Medicine

## 2015-07-01 DIAGNOSIS — R29898 Other symptoms and signs involving the musculoskeletal system: Secondary | ICD-10-CM

## 2015-07-19 ENCOUNTER — Ambulatory Visit
Admission: RE | Admit: 2015-07-19 | Discharge: 2015-07-19 | Disposition: A | Discharge status: Home / Self Care | Payer: Medicare HMO | Source: Ambulatory Visit | Attending: Family Medicine | Admitting: Family Medicine

## 2015-07-19 DIAGNOSIS — R29898 Other symptoms and signs involving the musculoskeletal system: Secondary | ICD-10-CM

## 2015-07-23 ENCOUNTER — Ambulatory Visit (INDEPENDENT_AMBULATORY_CARE_PROVIDER_SITE_OTHER): Payer: Medicare HMO | Admitting: Neurology

## 2015-07-23 ENCOUNTER — Encounter: Payer: Self-pay | Admitting: Neurology

## 2015-07-23 VITALS — BP 136/82 | HR 64 | Ht 63.0 in | Wt 171.0 lb

## 2015-07-23 DIAGNOSIS — M4802 Spinal stenosis, cervical region: Secondary | ICD-10-CM | POA: Diagnosis not present

## 2015-07-23 DIAGNOSIS — M48061 Spinal stenosis, lumbar region without neurogenic claudication: Secondary | ICD-10-CM

## 2015-07-23 DIAGNOSIS — M4806 Spinal stenosis, lumbar region: Secondary | ICD-10-CM | POA: Diagnosis not present

## 2015-07-23 DIAGNOSIS — M6250 Muscle wasting and atrophy, not elsewhere classified, unspecified site: Secondary | ICD-10-CM | POA: Diagnosis not present

## 2015-07-23 DIAGNOSIS — R269 Unspecified abnormalities of gait and mobility: Secondary | ICD-10-CM | POA: Diagnosis not present

## 2015-07-23 DIAGNOSIS — R531 Weakness: Secondary | ICD-10-CM

## 2015-07-23 NOTE — Progress Notes (Signed)
Chief Complaint  Patient presents with  . Cervical Stenosis    She went to [facility] for her initial evaluation and has a follow up to discuss his recommended treatment on 07/29/15.  She feels her weakness is more progressed than her last visit.  Her PCP ordered a MRI brain recently and she wanted you to look at it too.  She has been started on an antibiotic as a result of the infection found on her scan.      PATIENT: Jessica Byrd DOB: 06/14/1929  HISTORICAL  Jessica Byrd  Is 80 years old right-handed Caucasian female, referred by her primary care physician PA Adron Bene for evaluation of bilateral upper extremity weakness  She has history of monoclonal gammopathy of unknown clinical significance, hypertenion, hyperlipidemia, history of ovarian cancer at age 76,  had total hysterectomy, DM.  She presented with bilateral proximal arm muscle weakness, she had long-standing history of bilateral rotator cuff, limited range of motion of bilateral shoulder, had a previous right rotator cuff surgery, she fell April 07 2014,  Landed on her right shoulder, also had left fourth finger fracture,  Wear brace for a while  since that fall, she noticed increased difficulty raising both arms overhead, difficulty using her hands, worsening gait difficulty, dragging her right leg more,  Worsening urinary urgency, stress incontinence, she denies significant pain, no sensor loss,  No chewing swallowing difficulties, no double  vision   She had a history of vocal cord repair surgery, hoarse voice at baseline  UPDATE April 6th 2016: I have reviewed MRI film with patient and her son  MRI cervical: Multi-level degenerative changes as noted above from C2-3 to C7-T1. Findings are most severe at C5-6 and C6-7 with mild spinal stenosis and severe biforaminal stenosis. No cord signal changes are noted. 2. Degenerative pannus formation posterior to the odontoid process, which minimally contacts the  ventral upper cervical spinal cord at C2 level.  3. Partial bone fusion of C4-5 on degenerative basis.  She returned for electrodiagnostic study today, which has demonstrated chronic right cervical, right lumbosacral neuropathic changes, there was no active denervation along right cervical, thoracic, lumbar sacral paraspinal muscles.  UPDATE April 26th 2016: She is with her son, she denies significant pain, arms are weaker, left worse than right. I have reviewed MRI cervical   1. Multi-level degenerative changes as noted above from C2-3 to C7-T1. Findings are most severe at C5-6 and C6-7 with mild spinal stenosis and severe biforaminal stenosis. No cord signal changes are noted. 2. Degenerative pannus formation posterior to the odontoid process, which minimally contacts the ventral upper cervical spinal cord at C2 level.  3. Partial bone fusion of C4-5 on degenerative basis. 4. Findings are similar to CT cervical spine from 04/07/14.   MRI of the lumbar spine showing multilevel severe degenerative changes at a detailed above. The most significant findings are: 1. Mild spinal stenosis at L3 L4 due to disc protrusion, endplate spurring, facet hypertrophy and ligamentum flavum hypertrophy. There is probable right L4 and there is some encroachment on the left L4 and both L3 nerve roots at this level. 2. Moderately severe spinal stenosis at L4-L5 due to anterolisthesis, disc protrusion, facet hypertrophy and ligament of flavum hypertrophy. She'll for compression of either the traversing L5 nerve roots at this level. 3. There is less potential for nerve root compression at the other lumbar levels. 4.There is a chronic 20% compression fracture of the L2 vertebral body. This was subacute on  the 05/07/2008 MRI.5.Compared to 05/07/2008, there has been some progression of the degenerative changes at L3-L4 and L4-L5    UPDATE July 27th 2016: She continues to get weaker, hands are weaker, increased  gait difficulty, she denies breathing difficulty, she denies dysarthria, no dysphagia.   We have reviewed MRI of the brain, mild generalized atrophy, mild small vessel disease,  I again reviewed MRI of lumbar spine with patient, moderate canal stenosis at L4-5, mild canal stenosis at L3-4  She was evaluated by neurosurgeon Dr. Saintclair Halsted, who do not think the degree of cervical stenosis would cause the profound abnormality weakness patient experienced  REVIEW OF SYSTEMS: Full 14 system review of systems performed and notable only for shortness of breath, walking difficulty, bruise easily, numbness, tremor ALLERGIES: Allergies  Allergen Reactions  . Penicillins Other (See Comments)    Stiffness in hands    HOME MEDICATIONS: Current Outpatient Prescriptions  Medication Sig Dispense Refill  . amLODipine (NORVASC) 10 MG tablet Take 10 mg by mouth daily with breakfast.     . aspirin 325 MG tablet Take 325 mg by mouth daily.    . Calcium-Vitamin D (CALTRATE 600 PLUS-VIT D PO) Take 1 capsule by mouth 2 (two) times daily.    . Glucosamine-Chondroit-Vit C-Mn (GLUCOSAMINE CHONDR 1500 COMPLX PO) Take 1 tablet by mouth daily.     Marland Kitchen levothyroxine (SYNTHROID, LEVOTHROID) 75 MCG tablet Take 75 mcg by mouth daily before breakfast.     . lisinopril-hydrochlorothiazide (PRINZIDE,ZESTORETIC) 20-12.5 MG per tablet Take 1 tablet by mouth daily with breakfast.     . loperamide (IMODIUM A-D) 2 MG tablet Take 2 mg by mouth daily with breakfast.     . omeprazole (PRILOSEC) 20 MG capsule Take 20 mg by mouth daily with breakfast.     . ONE TOUCH ULTRA TEST test strip     . pravastatin (PRAVACHOL) 40 MG tablet Take 80 mg by mouth every evening.     . psyllium (METAMUCIL) 58.6 % powder Take 1 packet by mouth 2 (two) times daily.       PAST MEDICAL HISTORY: Past Medical History  Diagnosis Date  . HTN (hypertension)   . Hyperlipidemia   . Asthma   . GERD (gastroesophageal reflux disease)   . Hypothyroid   .  Monoclonal gammopathies   . CKD (chronic kidney disease) stage 3, GFR 30-59 ml/min   . Borderline diabetes mellitus   . Ovarian cancer 1972    reportedly early stage; s/p TAH/BSO and adjuvant radaition.  Marland Kitchen MGUS (monoclonal gammopathy of unknown significance) 07/07/2012  . Diabetes mellitus without complication   . Anemia in chronic illness 02/14/2015    PAST SURGICAL HISTORY:  Past Surgical History  Procedure Laterality Date  . Total abdominal hysterectomy w/ bilateral salpingoophorectomy    . Umibical hernia repair    . Right rotator cuff surgery    . Thyroidectomy, partial    . Vocal cord repair    . Colonoscopy  2008    reportedly negative per patient.   Marland Kitchen Appendectomy    . Incontinence surgery      FAMILY HISTORY: Family History  Problem Relation Age of Onset  . Cancer Father 47    gastric   . Cancer Sister     lung cancer  . Cancer Brother 34    cholangiocarcinoma    SOCIAL HISTORY:  History   Social History  . Marital Status: Married    Spouse Name: N/A  . Number of Children: 1  .  Years of Education: 12   Occupational History  . Retired    Social History Main Topics  . Smoking status: Never Smoker   . Smokeless tobacco: Never Used  . Alcohol Use: No  . Drug Use: No  . Sexual Activity: Not on file   Other Topics Concern  . Not on file   Social History Narrative   Lives at home with her husband.   Right-handed.   1 or less cups caffeine/day.   PHYSICAL EXAM   Filed Vitals:   07/23/15 1325  BP: 136/82  Pulse: 64  Height: 5\' 3"  (1.6 m)  Weight: 171 lb (77.565 kg)    Not recorded      Body mass index is 30.3 kg/(m^2).  PHYSICAL EXAMNIATION:  Gen: NAD, conversant, well nourised, obese, well groomed                     Cardiovascular: Regular rate rhythm, no peripheral edema, warm, nontender. Eyes: Conjunctivae clear without exudates or hemorrhage Neck: Supple, no carotid bruise. Pulmonary: Clear to auscultation bilaterally    NEUROLOGICAL EXAM:  MENTAL STATUS: Speech:    Speech is normal; fluent and spontaneous with normal comprehension.  Cognition:    The patient is oriented to person, place, and time;     recent and remote memory intact;     language fluent;     normal attention, concentration,     fund of knowledge.  CRANIAL NERVES: CN II: Visual fields are full to confrontation. Pupil were equal round reactive to light CN III, IV, VI: extraocular movement are normal. No ptosis. CN V: Facial sensation is intact to pinprick in all 3 divisions bilaterally. Corneal responses are intact.  CN VII: Face is symmetric with normal eye closure and smile. CN VIII: Hearing is normal to rubbing fingers CN IX, X: Palate elevates symmetrically. Phonation is normal. CN XI: Head turning and shoulder shrug are intact CN XII: Tongue is midline with normal movements and no atrophy.  MOTOR:  bilateral shoulder muscle atrophy,   Shoulder abduction Shoulder external rotation Elbow flexion Elbow extension Wrist flexion Wrist extension Finger abduction Hip flexion Knee flexion Knee extension Ankle dorsi flexion Ankle plantar flexion  R  3  3  4 5  5- 5- 5- 5 5 5  4- 5  L  3 minus  3 minus 4- 5 5- 5- 5- 5 5 5 4 5   Moderate bilateral abductor pollicis brevis, opponens weakness.  REFLEXES: Reflexes are 2+ and symmetric at the biceps, triceps, 2+ knees, and ankles. Plantar responses are extensor bilaterally  SENSORY: Light touch, pinprick, position sense, and vibration sense are intact in fingers and toes.  COORDINATION: Rapid alternating movements and fine finger movements are intact. There is no dysmetria on finger-to-nose and heel-knee-shin. There are no abnormal or extraneous movements.   GAIT/STANCE:  limp, wide-based, pelvic thrust, dragging her right leg more. Romberg is absent.   DIAGNOSTIC DATA (LABS, IMAGING, TESTING) - I reviewed patient records, labs, notes, testing and imaging myself where  available.  Lab Results  Component Value Date   WBC 10.2 02/07/2015   HGB 11.0* 02/07/2015   HCT 33.9* 02/07/2015   MCV 93.3 02/07/2015   PLT 182 02/07/2015      Component Value Date/Time   NA 143 02/07/2015 1222   NA 143 07/07/2012 1256   K 4.2 02/07/2015 1222   K 4.2 07/07/2012 1256   CL 106 04/30/2013 1305   CL 108 07/07/2012 1256   CO2  27 02/07/2015 1222   CO2 26 07/07/2012 1256   GLUCOSE 110 02/07/2015 1222   GLUCOSE 81 04/30/2013 1305   GLUCOSE 118* 07/07/2012 1256   BUN 32.6* 02/07/2015 1222   BUN 35* 07/07/2012 1256   CREATININE 1.2* 02/07/2015 1222   CREATININE 1.28* 07/07/2012 1256   CALCIUM 9.5 02/07/2015 1222   CALCIUM 9.3 07/07/2012 1256   PROT 6.6 02/07/2015 1222   PROT 6.6 07/07/2012 1256   ALBUMIN 3.6 02/07/2015 1222   ALBUMIN 4.0 07/07/2012 1256   AST 20 02/07/2015 1222   AST 19 07/07/2012 1256   ALT 12 02/07/2015 1222   ALT 12 07/07/2012 1256   ALKPHOS 80 02/07/2015 1222   ALKPHOS 62 07/07/2012 1256   BILITOT 0.26 02/07/2015 1222   BILITOT 0.4 07/07/2012 1256   ASSESSMENT AND PLAN  Jessica Byrd is a 81 y.o. female with past medical history of bilateral rotator cuff, presenting with progressive weakness, since she fell in April 2015, on examination, she has profound bilateral upper extremity proximal muscle weakness, mild bilateral hands weakness, moderate bilateral ankle dorsiflexion weakness,  gait difficulty, hyperreflexia of bilateral lower extremities, bilateral Babinski signs,  abnormality on MRI of cervical spine, and MRI lumbar spine detailed above  Differentiation diagnosis includes bilateral cervical, lumbar radiculopathy, but there was no significant cervical cord compression on MRI of cervical spine, she also lacks of sensory complaints, which also does not support cervical myelopathy.  Differentiation diagnosis also includes motor neuron disease, laboratory evaluation today Refer her to [facility] neuromuscular specialist Return to  clinic in 3 months    Marcial Pacas, M.D. Ph.D.  [facility] [address], [place], [place] [address] Ph: [phone] Fax: [phone]

## 2015-07-25 ENCOUNTER — Ambulatory Visit: Payer: Medicare HMO | Admitting: Neurology

## 2015-07-29 ENCOUNTER — Telehealth: Payer: Self-pay | Admitting: *Deleted

## 2015-07-29 NOTE — Telephone Encounter (Signed)
Patient has an appt with Dr. Chase Caller ([facility]) on October 31st at 1pm.  Provided her with the appt time, location and contact information.  Her required records have been faxed to his office.

## 2015-08-01 ENCOUNTER — Telehealth: Payer: Self-pay | Admitting: Neurology

## 2015-08-01 LAB — VITAMIN D 1,25 DIHYDROXY
VITAMIN D 1, 25 (OH) TOTAL: 33 pg/mL
VITAMIN D3 1, 25 (OH): 33 pg/mL
Vitamin D2 1, 25 (OH)2: <10 pg/mL

## 2015-08-01 LAB — ANA W/REFLEX IF POSITIVE
ANA: POSITIVE — AB
Anti JO-1: <0.2 AI (ref 0.0–0.9)
Centromere Ab Screen: 4.2 AI — ABNORMAL HIGH (ref 0.0–0.9)
DSDNA AB: 1 [IU]/mL (ref 0–9)
ENA SM Ab Ser-aCnc: <0.2 AI (ref 0.0–0.9)
ENA SSA (RO) Ab: 0.3 AI (ref 0.0–0.9)

## 2015-08-01 LAB — THYROID PANEL WITH TSH
Free Thyroxine Index: 2.4 (ref 1.2–4.9)
T3 Uptake Ratio: 26 % (ref 24–39)
T4, Total: 9.1 ug/dL (ref 4.5–12.0)
TSH: 0.895 u[IU]/mL (ref 0.450–4.500)

## 2015-08-01 LAB — VITAMIN B12: VITAMIN B 12: 377 pg/mL (ref 211–946)

## 2015-08-01 LAB — C-REACTIVE PROTEIN: CRP: 2.7 mg/L (ref 0.0–4.9)

## 2015-08-01 LAB — SEDIMENTATION RATE: Sed Rate: 14 mm/hr (ref 0–40)

## 2015-08-01 LAB — VITAMIN B1: Thiamine: 139.8 nmol/L (ref 66.5–200.0)

## 2015-08-01 LAB — CK: CK TOTAL: 356 U/L — AB (ref 24–173)

## 2015-08-01 LAB — GM1 ANTIBODY IGG, IGM: GM 1 IgM: <1:100 {titer}

## 2015-08-01 LAB — RPR: RPR: NONREACTIVE

## 2015-08-01 LAB — COPPER, SERUM: Copper: 136 ug/dL (ref 72–166)

## 2015-08-01 NOTE — Telephone Encounter (Signed)
Please call patient, laboratory evaluation showed positive ANA, with positive centromere antibody, of unknown clinical significance, Mild elevated CPK 356, is consistent with her muscle weakness, and wasting,

## 2015-08-04 NOTE — Telephone Encounter (Signed)
Spoke to patient she is aware of results

## 2015-08-08 ENCOUNTER — Other Ambulatory Visit (HOSPITAL_BASED_OUTPATIENT_CLINIC_OR_DEPARTMENT_OTHER): Payer: Medicare HMO

## 2015-08-08 DIAGNOSIS — D472 Monoclonal gammopathy: Secondary | ICD-10-CM | POA: Diagnosis not present

## 2015-08-08 DIAGNOSIS — N183 Chronic kidney disease, stage 3 unspecified: Secondary | ICD-10-CM

## 2015-08-08 DIAGNOSIS — D638 Anemia in other chronic diseases classified elsewhere: Secondary | ICD-10-CM

## 2015-08-08 LAB — CBC WITH DIFFERENTIAL/PLATELET
BASO%: 0.9 % (ref 0.0–2.0)
Basophils Absolute: 0.1 10*3/uL (ref 0.0–0.1)
EOS%: 2.1 % (ref 0.0–7.0)
Eosinophils Absolute: 0.2 10*3/uL (ref 0.0–0.5)
HCT: 33.3 % — ABNORMAL LOW (ref 34.8–46.6)
HGB: 11 g/dL — ABNORMAL LOW (ref 11.6–15.9)
LYMPH#: 1.4 10*3/uL (ref 0.9–3.3)
LYMPH%: 20.5 % (ref 14.0–49.7)
MCH: 31.2 pg (ref 25.1–34.0)
MCHC: 33.1 g/dL (ref 31.5–36.0)
MCV: 94.3 fL (ref 79.5–101.0)
MONO#: 0.6 10*3/uL (ref 0.1–0.9)
MONO%: 7.9 % (ref 0.0–14.0)
NEUT#: 4.9 10*3/uL (ref 1.5–6.5)
NEUT%: 68.6 % (ref 38.4–76.8)
Platelets: 172 10*3/uL (ref 145–400)
RBC: 3.53 10*6/uL — AB (ref 3.70–5.45)
RDW: 13.9 % (ref 11.2–14.5)
WBC: 7.1 10*3/uL (ref 3.9–10.3)

## 2015-08-08 LAB — COMPREHENSIVE METABOLIC PANEL (CC13)
ALK PHOS: 64 U/L (ref 40–150)
ALT: 17 U/L (ref 0–55)
AST: 19 U/L (ref 5–34)
Albumin: 4 g/dL (ref 3.5–5.0)
Anion Gap: 9 mEq/L (ref 3–11)
BUN: 43.7 mg/dL — AB (ref 7.0–26.0)
CALCIUM: 9.5 mg/dL (ref 8.4–10.4)
CO2: 18 mEq/L — ABNORMAL LOW (ref 22–29)
CREATININE: 1.5 mg/dL — AB (ref 0.6–1.1)
Chloride: 112 mEq/L — ABNORMAL HIGH (ref 98–109)
EGFR: 32 mL/min/{1.73_m2} — ABNORMAL LOW (ref >90)
Glucose: 81 mg/dl (ref 70–140)
Potassium: 5.4 mEq/L — ABNORMAL HIGH (ref 3.5–5.1)
Sodium: 139 mEq/L (ref 136–145)
Total Bilirubin: 0.27 mg/dL (ref 0.20–1.20)
Total Protein: 7 g/dL (ref 6.4–8.3)

## 2015-08-11 ENCOUNTER — Telehealth: Payer: Self-pay | Admitting: Hematology and Oncology

## 2015-08-11 NOTE — Telephone Encounter (Signed)
S/w pt confirming MD visit per 08/15 MD staff msg r/s from 08/19 to 08/23 due to test results are not back in... KJ

## 2015-08-12 LAB — SPEP & IFE WITH QIG
ALBUMIN ELP: 4 g/dL (ref 3.8–4.8)
ALPHA-2-GLOBULIN: 0.8 g/dL (ref 0.5–0.9)
Alpha-1-Globulin: 0.4 g/dL — ABNORMAL HIGH (ref 0.2–0.3)
BETA 2: 0.4 g/dL (ref 0.2–0.5)
BETA GLOBULIN: 0.5 g/dL (ref 0.4–0.6)
GAMMA GLOBULIN: 0.7 g/dL — AB (ref 0.8–1.7)
IgA: 188 mg/dL (ref 69–380)
IgG (Immunoglobin G), Serum: 742 mg/dL (ref 690–1700)
IgM, Serum: 19 mg/dL — ABNORMAL LOW (ref 52–322)
Total Protein, Serum Electrophoresis: 6.7 g/dL (ref 6.1–8.1)

## 2015-08-12 LAB — KAPPA/LAMBDA LIGHT CHAINS
Kappa free light chain: 99.9 mg/dL — ABNORMAL HIGH (ref 0.33–1.94)
Kappa:Lambda Ratio: 28.38 — ABNORMAL HIGH (ref 0.26–1.65)
Lambda Free Lght Chn: 3.52 mg/dL — ABNORMAL HIGH (ref 0.57–2.63)

## 2015-08-15 ENCOUNTER — Ambulatory Visit: Payer: Medicare HMO | Admitting: Hematology and Oncology

## 2015-08-15 LAB — 24 HR URINE,KAPPA/LAMBDA LIGHT CHAINS
24H Urine Volume: 1200 mL/24 h
Measured Kappa Chain: 34.2 mg/dL — ABNORMAL HIGH (ref <2.00)
Total Kappa Chain: 410.4 mg/24 h

## 2015-08-15 LAB — UPEP/TP, 24-HR URINE
ALPHA-2-GLOBULIN, U: 15.8 %
Albumin: 6.8 %
Alpha-1-Globulin, U: 23.2 %
BETA GLOBULIN, U: 49.7 %
Collection Interval: 24 hours
Gamma Globulin, U: 4.5 %
Total Protein, Urine/Day: 372 mg/d — ABNORMAL HIGH (ref 50–100)
Total Protein, Urine: 31 mg/dL
Total Volume, Urine: 1200 mL

## 2015-08-15 LAB — UIFE/LIGHT CHAINS/TP QN, 24-HR UR
Albumin, U: DETECTED
Alpha 1, Urine: DETECTED — AB
Alpha 2, Urine: DETECTED — AB
Beta, Urine: DETECTED — AB
Gamma Globulin, Urine: DETECTED — AB
TIME-UPE24: 24 h
TOTAL PROTEIN, URINE-UPE24: 31 mg/dL — AB (ref 5–24)
VOLUME, URINE-UPE24: 1200 mL

## 2015-08-19 ENCOUNTER — Telehealth: Payer: Self-pay | Admitting: Hematology and Oncology

## 2015-08-19 ENCOUNTER — Ambulatory Visit (HOSPITAL_BASED_OUTPATIENT_CLINIC_OR_DEPARTMENT_OTHER): Payer: Medicare HMO | Admitting: Hematology and Oncology

## 2015-08-19 ENCOUNTER — Ambulatory Visit (HOSPITAL_BASED_OUTPATIENT_CLINIC_OR_DEPARTMENT_OTHER): Payer: Medicare HMO

## 2015-08-19 ENCOUNTER — Telehealth: Payer: Self-pay | Admitting: *Deleted

## 2015-08-19 ENCOUNTER — Encounter: Payer: Self-pay | Admitting: Hematology and Oncology

## 2015-08-19 VITALS — BP 137/58 | HR 72 | Temp 98.0°F | Resp 18 | Ht 63.0 in | Wt 170.9 lb

## 2015-08-19 DIAGNOSIS — D638 Anemia in other chronic diseases classified elsewhere: Secondary | ICD-10-CM | POA: Diagnosis not present

## 2015-08-19 DIAGNOSIS — D631 Anemia in chronic kidney disease: Secondary | ICD-10-CM

## 2015-08-19 DIAGNOSIS — R531 Weakness: Secondary | ICD-10-CM

## 2015-08-19 DIAGNOSIS — N189 Chronic kidney disease, unspecified: Secondary | ICD-10-CM

## 2015-08-19 DIAGNOSIS — D472 Monoclonal gammopathy: Secondary | ICD-10-CM

## 2015-08-19 DIAGNOSIS — T50905A Adverse effect of unspecified drugs, medicaments and biological substances, initial encounter: Secondary | ICD-10-CM

## 2015-08-19 DIAGNOSIS — E875 Hyperkalemia: Secondary | ICD-10-CM | POA: Diagnosis not present

## 2015-08-19 DIAGNOSIS — N184 Chronic kidney disease, stage 4 (severe): Secondary | ICD-10-CM

## 2015-08-19 DIAGNOSIS — M6259 Muscle wasting and atrophy, not elsewhere classified, multiple sites: Secondary | ICD-10-CM

## 2015-08-19 DIAGNOSIS — M6250 Muscle wasting and atrophy, not elsewhere classified, unspecified site: Secondary | ICD-10-CM

## 2015-08-19 DIAGNOSIS — C184 Malignant neoplasm of transverse colon: Secondary | ICD-10-CM

## 2015-08-19 LAB — BASIC METABOLIC PANEL (CC13)
ANION GAP: 8 meq/L (ref 3–11)
BUN: 45.6 mg/dL — AB (ref 7.0–26.0)
CO2: 17 mEq/L — ABNORMAL LOW (ref 22–29)
CREATININE: 1.7 mg/dL — AB (ref 0.6–1.1)
Calcium: 9.6 mg/dL (ref 8.4–10.4)
Chloride: 116 mEq/L — ABNORMAL HIGH (ref 98–109)
EGFR: 28 mL/min/{1.73_m2} — ABNORMAL LOW (ref >90)
GLUCOSE: 83 mg/dL (ref 70–140)
POTASSIUM: 5.3 meq/L — AB (ref 3.5–5.1)
Sodium: 141 mEq/L (ref 136–145)

## 2015-08-19 NOTE — Telephone Encounter (Addendum)
Pt confirmed labs/ov per 08/23 POF, gave pt avs and calendar... KJ, lft msg with PT to contact pt to schedule for referral..Marland KitchenMarland Kitchen

## 2015-08-19 NOTE — Telephone Encounter (Signed)
Spoke with son- gave instructions below.  Will call and leave on patient's answering machine as well

## 2015-08-19 NOTE — Telephone Encounter (Signed)
-----   Message from Heath Lark, MD sent at 08/19/2015  1:55 PM EDT ----- Regarding: BMP worse Pls tell patient to stop lisinopril Drink 2 liter of liquid today Try to get in to see Dr. Justin Mend not later by end of next week. If she cannot get in, see PCP first I will forward results to both PCP and Dr. Justin Mend ----- Message -----    From: [facility]    Sent: 08/19/2015   1:27 PM      To: Heath Lark, MD

## 2015-08-20 DIAGNOSIS — E875 Hyperkalemia: Secondary | ICD-10-CM | POA: Insufficient documentation

## 2015-08-20 DIAGNOSIS — T50905A Adverse effect of unspecified drugs, medicaments and biological substances, initial encounter: Secondary | ICD-10-CM

## 2015-08-20 NOTE — Assessment & Plan Note (Signed)
She has very mild worsening light chain that overall is not consistent with significant disease progression I do not believe that the worsening renal failure is related to the MGUS I recommend repeat blood work history and physical examination in 6 months

## 2015-08-20 NOTE — Assessment & Plan Note (Signed)
She has not acute on chronic renal failure. I repeated her blood work again and is still show consistent elevated creatinine function. I recommend she consult with her primary care doctor or her nephrologist as soon as possible. I recommend she increase oral fluid hydration and to stop the lisinopril until further evaluation.

## 2015-08-20 NOTE — Assessment & Plan Note (Signed)
She has significant muscle atrophy and inability to bend her elbows and elevate her shoulders She has under gone extensive evaluation with a neurologist and had an MRI which shows significant degenerative disc disease with possible nerve impingement. I am not sure if there is any form of surgery that could be done to improve her muscle function. I recommend physical therapist and she agreed to be referred to preserve her remaining muscular function and physical strength

## 2015-08-20 NOTE — Progress Notes (Signed)
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 She was noted to have monoclonal gammopathy of unknown significance, with predominant light chain secretion. In July 2013 she had bone marrow biopsy which only showed 2% plasma cells. She is being observed.  INTERVAL HISTORY: Please see below for problem oriented charting. She complained of progressive weakness with inability to elevate above her shoulder and bend her elbows. She has seen neurologists and underwent extensive evaluation with multiple imaging studies. On 03/21/2015, she underwent imaging study which show multilevel degenerative disc disease with significant spinal stenosis The patient is quite debilitated with limited function in her hand and arm with associated weakness. That is preserved sensation She denies other neurological deficits She denies recurrent infection. She denies new bone pain REVIEW OF SYSTEMS:   Constitutional: Denies fevers, chills or abnormal weight loss Eyes: Denies blurriness of vision Ears, nose, mouth, throat, and face: Denies mucositis or sore throat Respiratory: Denies cough, dyspnea or wheezes Cardiovascular: Denies palpitation, chest discomfort or lower extremity swelling Gastrointestinal:  Denies nausea, heartburn or change in bowel habits Skin: Denies abnormal skin rashes Lymphatics: Denies new lymphadenopathy or easy bruising Behavioral/Psych: Mood is stable, no new changes  All other systems were reviewed with the patient and are negative.  I have reviewed the past medical history, past surgical history, social history and family  history with the patient and they are unchanged from previous note.  ALLERGIES:  is allergic to penicillins.  MEDICATIONS:  Current Outpatient Prescriptions  Medication Sig Dispense Refill  . amLODipine (NORVASC) 10 MG tablet Take 10 mg by mouth daily with breakfast.     . aspirin 325 MG tablet Take 325 mg by mouth daily.    . Glucosamine-Chondroit-Vit C-Mn (GLUCOSAMINE CHONDR 1500 COMPLX PO) Take 1 tablet by mouth daily.     Marland Kitchen levothyroxine (SYNTHROID, LEVOTHROID) 75 MCG tablet Take 75 mcg by mouth daily before breakfast.     . lisinopril-hydrochlorothiazide (PRINZIDE,ZESTORETIC) 20-12.5 MG per tablet Take 1 tablet by mouth daily with breakfast.     . loperamide (IMODIUM A-D) 2 MG tablet Take 2 mg by mouth daily with breakfast.     . omeprazole (PRILOSEC) 20 MG capsule Take 20 mg by mouth daily with breakfast.     . ONE TOUCH ULTRA TEST test strip     . pravastatin (PRAVACHOL) 40 MG tablet Take 80 mg by mouth every evening.     . psyllium (METAMUCIL) 58.6 % powder Take 1 packet by mouth 2 (two) times daily.    Marland Kitchen sulfamethoxazole-trimethoprim (BACTRIM,SEPTRA) 200-40 MG/5ML suspension Take by mouth 2 (two) times daily.    . Calcium-Vitamin D (CALTRATE 600 PLUS-VIT D PO) Take 1 capsule by mouth 2 (two) times daily.     No current facility-administered medications for this visit.    PHYSICAL EXAMINATION: ECOG PERFORMANCE STATUS: 1 - Symptomatic but completely ambulatory  Filed Vitals:   08/19/15 1206  BP: 137/58  Pulse: 72  Temp: 98 F (36.7 C)  Resp: 18   Filed Weights   08/19/15 1206  Weight: 170 lb 14.4 oz (77.52 kg)    GENERAL:alert, no distress and comfortable SKIN: skin color, texture, turgor are normal, no rashes or significant  lesions EYES: normal, Conjunctiva are pink and non-injected, sclera clear OROPHARYNX:no exudate, no erythema and lips, buccal mucosa, and tongue normal  NECK: supple, thyroid normal size, non-tender, without nodularity LYMPH:  no palpable  lymphadenopathy in the cervical, axillary or inguinal LUNGS: clear to auscultation and percussion with normal breathing effort HEART: regular rate & rhythm and no murmurs and no lower extremity edema ABDOMEN:abdomen soft, non-tender and normal bowel sounds Musculoskeletal:no cyanosis of digits and no clubbing  NEURO: alert & oriented x 3 with fluent speech, with associated weakness in both arms  LABORATORY DATA:  I have reviewed the data as listed    Component Value Date/Time   NA 141 08/19/2015 1251   NA 143 07/07/2012 1256   K 5.3* 08/19/2015 1251   K 4.2 07/07/2012 1256   CL 106 04/30/2013 1305   CL 108 07/07/2012 1256   CO2 17* 08/19/2015 1251   CO2 26 07/07/2012 1256   GLUCOSE 83 08/19/2015 1251   GLUCOSE 81 04/30/2013 1305   GLUCOSE 118* 07/07/2012 1256   BUN 45.6* 08/19/2015 1251   BUN 35* 07/07/2012 1256   CREATININE 1.7* 08/19/2015 1251   CREATININE 1.28* 07/07/2012 1256   CALCIUM 9.6 08/19/2015 1251   CALCIUM 9.3 07/07/2012 1256   PROT 7.0 08/08/2015 1239   PROT 6.6 07/07/2012 1256   ALBUMIN 4.0 08/08/2015 1239   ALBUMIN 4.0 07/07/2012 1256   AST 19 08/08/2015 1239   AST 19 07/07/2012 1256   ALT 17 08/08/2015 1239   ALT 12 07/07/2012 1256   ALKPHOS 64 08/08/2015 1239   ALKPHOS 62 07/07/2012 1256   BILITOT 0.27 08/08/2015 1239   BILITOT 0.4 07/07/2012 1256    No results found for: SPEP, UPEP  Lab Results  Component Value Date   WBC 7.1 08/08/2015   NEUTROABS 4.9 08/08/2015   HGB 11.0* 08/08/2015   HCT 33.3* 08/08/2015   MCV 94.3 08/08/2015   PLT 172 08/08/2015      Chemistry      Component Value Date/Time   NA 141 08/19/2015 1251   NA 143 07/07/2012 1256   K 5.3* 08/19/2015 1251   K 4.2 07/07/2012 1256   CL 106 04/30/2013 1305   CL 108 07/07/2012 1256   CO2 17* 08/19/2015 1251   CO2 26 07/07/2012 1256   BUN 45.6* 08/19/2015 1251   BUN 35* 07/07/2012 1256   CREATININE 1.7* 08/19/2015 1251   CREATININE 1.28* 07/07/2012 1256      Component  Value Date/Time   CALCIUM 9.6 08/19/2015 1251   CALCIUM 9.3 07/07/2012 1256   ALKPHOS 64 08/08/2015 1239   ALKPHOS 62 07/07/2012 1256   AST 19 08/08/2015 1239   AST 19 07/07/2012 1256   ALT 17 08/08/2015 1239   ALT 12 07/07/2012 1256   BILITOT 0.27 08/08/2015 1239   BILITOT 0.4 07/07/2012 1256     ASSESSMENT & PLAN:  MGUS (monoclonal gammopathy of unknown significance) She has very mild worsening light chain that overall is not consistent with significant disease progression I do not believe that the worsening renal failure is related to the MGUS I recommend repeat blood work history and physical examination in 6 months   Anemia in chronic renal disease This is likely anemia of chronic disease. The patient denies recent history of bleeding such as epistaxis, hematuria or hematochezia. She is asymptomatic from the anemia. We will observe for now.     CKD (chronic kidney disease) stage 4, GFR 15-29 ml/min She has not acute on chronic  renal failure. I repeated her blood work again and is still show consistent elevated creatinine function. I recommend she consult with her primary care doctor or her nephrologist as soon as possible. I recommend she increase oral fluid hydration and to stop the lisinopril until further evaluation.  Drug-induced hyperkalemia She has significant new onset of hyperkalemia, likely drug induced due to diminished excretion from renal failure. I recommend she hold lisinopril for now, pending further evaluation  Muscle atrophy She has significant muscle atrophy and inability to bend her elbows and elevate her shoulders She has under gone extensive evaluation with a neurologist and had an MRI which shows significant degenerative disc disease with possible nerve impingement. I am not sure if there is any form of surgery that could be done to improve her muscle function. I recommend physical therapist and she agreed to be referred to preserve her remaining  muscular function and physical strength   Orders Placed This Encounter  Procedures  . Comprehensive metabolic panel    Standing Status: Future     Number of Occurrences:      Standing Expiration Date: 09/22/2016  . CBC with Differential/Platelet    Standing Status: Future     Number of Occurrences:      Standing Expiration Date: 09/22/2016  . Basic metabolic panel    Standing Status: Future     Number of Occurrences: 1     Standing Expiration Date: 09/22/2016  . SPEP & IFE with QIG    Standing Status: Future     Number of Occurrences:      Standing Expiration Date: 09/22/2016  . Kappa/lambda light chains    Standing Status: Future     Number of Occurrences:      Standing Expiration Date: 09/22/2016  . Ambulatory referral to Physical Therapy    Referral Priority:  Routine    Referral Type:  Physical Medicine    Referral Reason:  Specialty Services Required    Requested Specialty:  Physical Therapy    Number of Visits Requested:  1   All questions were answered. The patient knows to call the clinic with any problems, questions or concerns. No barriers to learning was detected. I spent 25 minutes counseling the patient face to face. The total time spent in the appointment was 30 minutes and more than 50% was on counseling and review of test results     [facility], [place], MD 08/20/2015 7:51 AM

## 2015-08-20 NOTE — Assessment & Plan Note (Signed)
She has significant new onset of hyperkalemia, likely drug induced due to diminished excretion from renal failure. I recommend she hold lisinopril for now, pending further evaluation

## 2015-08-20 NOTE — Assessment & Plan Note (Signed)
This is likely anemia of chronic disease. The patient denies recent history of bleeding such as epistaxis, hematuria or hematochezia. She is asymptomatic from the anemia. We will observe for now.  

## 2015-09-23 ENCOUNTER — Telehealth: Payer: Self-pay | Admitting: Neurology

## 2015-09-23 NOTE — Telephone Encounter (Signed)
Margarito Courser PA with [facility] is calling and need medical records for patients last 2 appointments along with all tests proformed.  Please fax to [phone]

## 2015-09-24 NOTE — Telephone Encounter (Signed)
Records faxed on 09/24/15.

## 2015-10-21 ENCOUNTER — Encounter: Payer: Self-pay | Admitting: Neurology

## 2015-10-21 ENCOUNTER — Ambulatory Visit (INDEPENDENT_AMBULATORY_CARE_PROVIDER_SITE_OTHER): Payer: Medicare HMO | Admitting: Neurology

## 2015-10-21 ENCOUNTER — Other Ambulatory Visit: Payer: Self-pay | Admitting: *Deleted

## 2015-10-21 VITALS — BP 170/84 | HR 72 | Ht 63.0 in | Wt 165.5 lb

## 2015-10-21 DIAGNOSIS — M6250 Muscle wasting and atrophy, not elsewhere classified, unspecified site: Secondary | ICD-10-CM | POA: Diagnosis not present

## 2015-10-21 DIAGNOSIS — R531 Weakness: Secondary | ICD-10-CM | POA: Diagnosis not present

## 2015-10-21 DIAGNOSIS — R269 Unspecified abnormalities of gait and mobility: Secondary | ICD-10-CM

## 2015-10-21 NOTE — Progress Notes (Signed)
Chief Complaint  Patient presents with  . Extremity Weakness    She is here with her son, Patrick Jupiter.  Her weakness has continued to worsen.  She has a pending appointment with neuromuscular specialist at [facility] on 10/27/15.   Chief Complaint  Patient presents with  . Extremity Weakness    She is here with her son, Patrick Jupiter.  Her weakness has continued to worsen.  She has a pending appointment with neuromuscular specialist at [facility] on 10/27/15.      PATIENT: Jessica Byrd DOB: [birth year]  HISTORICAL  Jessica Byrd  Is 81 years old right-handed Caucasian female, referred by her primary care physician PA Adron Bene for evaluation of bilateral upper extremity weakness in March 07 2015.  She has history of monoclonal gammopathy of unknown clinical significance, hypertenion, hyperlipidemia, history of ovarian cancer at age 23,  had total hysterectomy, DM.  She presented with bilateral proximal arm muscle weakness, she had long-standing history of bilateral rotator cuff, limited range of motion of bilateral shoulder, had a previous right rotator cuff surgery, she fell April 07 2014,  Landed on her right shoulder, also had left fourth finger fracture,  Wear brace for a while  since that fall, she noticed increased difficulty raising both arms overhead, difficulty using her hands, worsening gait difficulty, dragging her right leg more,  Worsening urinary urgency, stress incontinence, she denies significant pain, no sensor loss,  No chewing swallowing difficulties, no double  vision   She had a history of vocal cord repair surgery, hoarse voice at baseline  UPDATE April 6th 2016: I have reviewed MRI film with patient and her son  MRI cervical: Multi-level degenerative changes as noted above from C2-3 to C7-T1. Findings are most severe at C5-6 and C6-7 with mild spinal stenosis and severe biforaminal stenosis. No cord signal changes are noted. 2. Degenerative pannus formation posterior to the  odontoid process, which minimally contacts the ventral upper cervical spinal cord at C2 level.  3. Partial bone fusion of C4-5 on degenerative basis.  She returned for electrodiagnostic study today, which has demonstrated chronic right cervical, right lumbosacral neuropathic changes, there was no active denervation along right cervical, thoracic, lumbar sacral paraspinal muscles.  UPDATE April 26th 2016: She is with her son, she denies significant pain, arms are weaker, left worse than right.   MRI of the lumbar spine showing multilevel severe degenerative changes at a detailed above. The most significant findings are: 1. Mild spinal stenosis at L3 L4 due to disc protrusion, endplate spurring, facet hypertrophy and ligamentum flavum hypertrophy. There is probable right L4 and there is some encroachment on the left L4 and both L3 nerve roots at this level. 2. Moderately severe spinal stenosis at L4-L5 due to anterolisthesis, disc protrusion, facet hypertrophy and ligament of flavum hypertrophy. She'll for compression of either the traversing L5 nerve roots at this level. 3. There is less potential for nerve root compression at the other lumbar levels. 4.There is a chronic 20% compression fracture of the L2 vertebral body. This was subacute on the 05/07/2008 MRI.5.Compared to 05/07/2008, there has been some progression of the degenerative changes at L3-L4 and L4-L5    UPDATE July 27th 2016: She continues to get weaker, hands are weaker, increased gait difficulty, she denies breathing difficulty, she denies dysarthria, no dysphagia.   We have reviewed MRI of the brain, mild generalized atrophy, mild small vessel disease,  I again reviewed MRI of lumbar spine with patient, moderate canal stenosis at L4-5,  mild canal stenosis at L3-4  She was evaluated by neurosurgeon Dr. Saintclair Halsted, who do not think the degree of cervical stenosis would cause the profound abnormality weakness patient  experienced  Update October 21 2015: I have reviewed extensive laboratory evaluation, mild elevated creatinine 1.7, elevated potassium 5.3, normal TSH, B12, vitamin B1, vitamin D, negative GM 1 antibody, mild elevated CPK 356, Negative RPR.  She continues to get weaker, she could not get her arms up to drive, she has to bend down to eat, she has no dysarthria, no dysphagia, she has more difficulty walking, use walker more. She has trouble breathing. She denies pain.   REVIEW OF SYSTEMS: Full 14 system review of systems performed and notable only for as above ALLERGIES: Allergies  Allergen Reactions  . Penicillins Other (See Comments)    Stiffness in hands    HOME MEDICATIONS: Current Outpatient Prescriptions  Medication Sig Dispense Refill  . amLODipine (NORVASC) 10 MG tablet Take 10 mg by mouth daily with breakfast.     . aspirin 325 MG tablet Take 325 mg by mouth daily.    . Calcium-Vitamin D (CALTRATE 600 PLUS-VIT D PO) Take 1 capsule by mouth 2 (two) times daily.    . Glucosamine-Chondroit-Vit C-Mn (GLUCOSAMINE CHONDR 1500 COMPLX PO) Take 1 tablet by mouth daily.     Marland Kitchen levothyroxine (SYNTHROID, LEVOTHROID) 75 MCG tablet Take 75 mcg by mouth daily before breakfast.     . lisinopril-hydrochlorothiazide (PRINZIDE,ZESTORETIC) 20-12.5 MG per tablet Take 1 tablet by mouth daily with breakfast.     . loperamide (IMODIUM A-D) 2 MG tablet Take 2 mg by mouth daily with breakfast.     . omeprazole (PRILOSEC) 20 MG capsule Take 20 mg by mouth daily with breakfast.     . ONE TOUCH ULTRA TEST test strip     . pravastatin (PRAVACHOL) 40 MG tablet Take 80 mg by mouth every evening.     . psyllium (METAMUCIL) 58.6 % powder Take 1 packet by mouth 2 (two) times daily.       PAST MEDICAL HISTORY: Past Medical History  Diagnosis Date  . HTN (hypertension)   . Hyperlipidemia   . Asthma   . GERD (gastroesophageal reflux disease)   . Hypothyroid   . Monoclonal gammopathies   . CKD (chronic  kidney disease) stage 3, GFR 30-59 ml/min   . Borderline diabetes mellitus   . Ovarian cancer 1972    reportedly early stage; s/p TAH/BSO and adjuvant radaition.  Marland Kitchen MGUS (monoclonal gammopathy of unknown significance) 07/07/2012  . Diabetes mellitus without complication   . Anemia in chronic illness 02/14/2015    PAST SURGICAL HISTORY:  Past Surgical History  Procedure Laterality Date  . Total abdominal hysterectomy w/ bilateral salpingoophorectomy    . Umibical hernia repair    . Right rotator cuff surgery    . Thyroidectomy, partial    . Vocal cord repair    . Colonoscopy  2008    reportedly negative per patient.   Marland Kitchen Appendectomy    . Incontinence surgery      FAMILY HISTORY: Family History  Problem Relation Age of Onset  . Cancer Father 6    gastric   . Cancer Sister     lung cancer  . Cancer Brother 38    cholangiocarcinoma    SOCIAL HISTORY:  Social History   Social History  . Marital Status: Married    Spouse Name: N/A  . Number of Children: 1  . Years of Education:  12   Occupational History  . Retired    Social History Main Topics  . Smoking status: Never Smoker   . Smokeless tobacco: Never Used  . Alcohol Use: No  . Drug Use: No  . Sexual Activity: Not on file   Other Topics Concern  . Not on file   Social History Narrative   Lives at home with her husband.   Right-handed.   1 or less cups caffeine/day.   PHYSICAL EXAM   Filed Vitals:   10/21/15 0904  Height: 5\' 3"  (1.6 m)  Weight: 165 lb 8 oz (75.07 kg)    Not recorded      Body mass index is 29.32 kg/(m^2).  PHYSICAL EXAMNIATION:  Gen: NAD, conversant, well nourised, obese, well groomed                     Cardiovascular: Regular rate rhythm, no peripheral edema, warm, nontender. Eyes: Conjunctivae clear without exudates or hemorrhage Neck: Supple, no carotid bruise. Pulmonary: Clear to auscultation bilaterally   NEUROLOGICAL EXAM:  MENTAL STATUS: Speech:    Speech is  normal; fluent and spontaneous with normal comprehension.  Cognition:    The patient is oriented to person, place, and time;     recent and remote memory intact;     language fluent;     normal attention, concentration,     fund of knowledge.  CRANIAL NERVES: CN II: Visual fields are full to confrontation. Pupil were equal round reactive to light CN III, IV, VI: extraocular movement are normal. No ptosis. CN V: Facial sensation is intact to pinprick in all 3 divisions bilaterally. Corneal responses are intact.  CN VII: Face is symmetric with normal eye closure and smile. CN VIII: Hearing is normal to rubbing fingers CN IX, X: Palate elevates symmetrically. Phonation is normal. CN XI: Head turning and shoulder shrug are intact CN XII: Tongue is midline with normal movements and no atrophy.  MOTOR:  bilateral shoulder muscle atrophy,   Shoulder abduction Shoulder external rotation Elbow flexion Elbow extension Wrist flexion Wrist extension Finger abduction Hip flexion Knee flexion Knee extension Ankle dorsi flexion Ankle plantar flexion  R  2 2  4 4  5- 5- 5- 4 5 5  4- 4  L  2  2 3  4+ 5- 5- 5- 4 5 5 4 4   Moderate bilateral abductor pollicis brevis, opponens weakness.  REFLEXES: Reflexes are 2+ and symmetric at the biceps, triceps, 2+ knees, and ankles. Plantar responses are extensor bilaterally  SENSORY: Light touch, pinprick, position sense, and vibration sense are intact in fingers and toes.  COORDINATION: No dysmetria noticed  GAIT/STANCE: Needed help to get up from seated position, bilateral foot drop, right worse than left  DIAGNOSTIC DATA (LABS, IMAGING, TESTING) - I reviewed patient records, labs, notes, testing and imaging myself where available.  ASSESSMENT AND PLAN  ALLAINA BROTZMAN is a 81 y.o. female with past medical history of bilateral rotator cuff, presenting with progressive weakness, since she fell in April 2015, on examination, she has profound bilateral  upper extremity proximal muscle weakness, mild bilateral hands weakness, moderate bilateral ankle dorsiflexion weakness,  gait difficulty, hyperreflexia of bilateral lower extremities, bilateral Babinski signs,  abnormality on MRI of cervical spine, and MRI lumbar spine detailed above, EMG nerve conduction study was abnormal in April 2016. There is electrodiagnostic evidence of chronic neuropathic changes involving right cervical, and right lumbosacral myotomes.   Differentiation diagnosis includes bilateral cervical, lumbar radiculopathy, but there  was no significant cervical cord compression on MRI of cervical spine, she also lacks of sensory complaints, which also does not support cervical myelopathy.  Differentiation diagnosis also includes motor neuron disease, laboratory evaluation failed to demonstrate treatable etiology She has a follow-up appointment with  [facility] neuromuscular speciin October 27 2015 Return to clinic in 6 months  Marcial Pacas, M.D. Ph.D.  [facility] [address], [place], [place] [address] Ph: [phone] Fax: [phone]

## 2016-02-06 ENCOUNTER — Other Ambulatory Visit (HOSPITAL_BASED_OUTPATIENT_CLINIC_OR_DEPARTMENT_OTHER): Payer: Medicare HMO

## 2016-02-06 DIAGNOSIS — D472 Monoclonal gammopathy: Secondary | ICD-10-CM

## 2016-02-06 LAB — COMPREHENSIVE METABOLIC PANEL
ALT: 14 U/L (ref 0–55)
ANION GAP: 10 meq/L (ref 3–11)
AST: 20 U/L (ref 5–34)
Albumin: 3.6 g/dL (ref 3.5–5.0)
Alkaline Phosphatase: 77 U/L (ref 40–150)
BILIRUBIN TOTAL: 0.37 mg/dL (ref 0.20–1.20)
BUN: 23.8 mg/dL (ref 7.0–26.0)
CO2: 25 meq/L (ref 22–29)
CREATININE: 0.8 mg/dL (ref 0.6–1.1)
Calcium: 9.3 mg/dL (ref 8.4–10.4)
Chloride: 110 mEq/L — ABNORMAL HIGH (ref 98–109)
EGFR: 67 mL/min/{1.73_m2} — ABNORMAL LOW (ref >90)
GLUCOSE: 110 mg/dL (ref 70–140)
Potassium: 4 mEq/L (ref 3.5–5.1)
SODIUM: 145 meq/L (ref 136–145)
TOTAL PROTEIN: 6.7 g/dL (ref 6.4–8.3)

## 2016-02-06 LAB — CBC WITH DIFFERENTIAL/PLATELET
BASO%: 0.9 % (ref 0.0–2.0)
Basophils Absolute: 0.1 10*3/uL (ref 0.0–0.1)
EOS%: 1.9 % (ref 0.0–7.0)
Eosinophils Absolute: 0.1 10*3/uL (ref 0.0–0.5)
HCT: 33.3 % — ABNORMAL LOW (ref 34.8–46.6)
HGB: 10.7 g/dL — ABNORMAL LOW (ref 11.6–15.9)
LYMPH#: 1.3 10*3/uL (ref 0.9–3.3)
LYMPH%: 21 % (ref 14.0–49.7)
MCH: 29.3 pg (ref 25.1–34.0)
MCHC: 32.2 g/dL (ref 31.5–36.0)
MCV: 91 fL (ref 79.5–101.0)
MONO#: 0.6 10*3/uL (ref 0.1–0.9)
MONO%: 9.3 % (ref 0.0–14.0)
NEUT%: 66.9 % (ref 38.4–76.8)
NEUTROS ABS: 4 10*3/uL (ref 1.5–6.5)
PLATELETS: 160 10*3/uL (ref 145–400)
RBC: 3.66 10*6/uL — AB (ref 3.70–5.45)
RDW: 14.5 % (ref 11.2–14.5)
WBC: 6.1 10*3/uL (ref 3.9–10.3)

## 2016-02-09 LAB — KAPPA/LAMBDA LIGHT CHAINS
Ig Lambda Free Light Chain: 31.61 mg/L — ABNORMAL HIGH (ref 5.71–26.30)
Kappa/Lambda FluidC Ratio: 36.79 — ABNORMAL HIGH (ref 0.26–1.65)

## 2016-02-09 LAB — MULTIPLE MYELOMA PANEL, SERUM
ALPHA2 GLOB SERPL ELPH-MCNC: 0.8 g/dL (ref 0.4–1.0)
Albumin SerPl Elph-Mcnc: 3.3 g/dL (ref 2.9–4.4)
Albumin/Glob SerPl: 1.2 (ref 0.7–1.7)
Alpha 1: 0.3 g/dL (ref 0.0–0.4)
B-Globulin SerPl Elph-Mcnc: 1.1 g/dL (ref 0.7–1.3)
Gamma Glob SerPl Elph-Mcnc: 0.7 g/dL (ref 0.4–1.8)
Globulin, Total: 2.9 g/dL (ref 2.2–3.9)
IGA/IMMUNOGLOBULIN A, SERUM: 163 mg/dL (ref 64–422)
IGG (IMMUNOGLOBIN G), SERUM: 710 mg/dL (ref 700–1600)
IGM (IMMUNOGLOBIN M), SRM: 20 mg/dL — AB (ref 26–217)
TOTAL PROTEIN: 6.2 g/dL (ref 6.0–8.5)

## 2016-02-13 ENCOUNTER — Ambulatory Visit (HOSPITAL_BASED_OUTPATIENT_CLINIC_OR_DEPARTMENT_OTHER): Payer: Medicare HMO | Admitting: Hematology and Oncology

## 2016-02-13 ENCOUNTER — Telehealth: Payer: Self-pay | Admitting: Hematology and Oncology

## 2016-02-13 ENCOUNTER — Encounter: Payer: Self-pay | Admitting: Hematology and Oncology

## 2016-02-13 VITALS — BP 154/63 | HR 84 | Temp 98.1°F | Resp 20 | Ht 63.0 in | Wt 167.2 lb

## 2016-02-13 DIAGNOSIS — D472 Monoclonal gammopathy: Secondary | ICD-10-CM

## 2016-02-13 DIAGNOSIS — G1221 Amyotrophic lateral sclerosis: Secondary | ICD-10-CM

## 2016-02-13 DIAGNOSIS — D631 Anemia in chronic kidney disease: Secondary | ICD-10-CM | POA: Diagnosis not present

## 2016-02-13 DIAGNOSIS — I10 Essential (primary) hypertension: Secondary | ICD-10-CM

## 2016-02-13 DIAGNOSIS — N189 Chronic kidney disease, unspecified: Secondary | ICD-10-CM | POA: Diagnosis not present

## 2016-02-13 NOTE — Assessment & Plan Note (Signed)
She has very mild worsening light chain that overall is not consistent with significant disease progression I recommend repeat blood work history and physical examination in 6 months

## 2016-02-13 NOTE — Progress Notes (Signed)
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 She was noted to have monoclonal gammopathy of unknown significance, with predominant light chain secretion. In July 2013 she had bone marrow biopsy which only showed 2% plasma cells. She is being observed.  INTERVAL HISTORY: Please see below for problem oriented charting. She complained of progressive weakness with inability to elevate above her shoulder and bend her elbows. She has seen neurologists and underwent extensive evaluation with multiple imaging studies.  she was recently diagnosed with ALS and is receiving treatment She denies recurrent infection. She denies new bone pain  REVIEW OF SYSTEMS:   Constitutional: Denies fevers, chills or abnormal weight loss Eyes: Denies blurriness of vision Ears, nose, mouth, throat, and face: Denies mucositis or sore throat Respiratory: Denies cough, dyspnea or wheezes Cardiovascular: Denies palpitation, chest discomfort or lower extremity swelling Gastrointestinal:  Denies nausea, heartburn or change in bowel habits Skin: Denies abnormal skin rashes Lymphatics: Denies new lymphadenopathy or easy bruising Behavioral/Psych: Mood is stable, no new changes  All other systems were reviewed with the patient and are negative.  I have reviewed the past medical history, past surgical history, social history and family history with the patient and they are unchanged from previous note.  ALLERGIES:  is allergic to penicillins.  MEDICATIONS:  Current Outpatient Prescriptions  Medication Sig Dispense Refill  . riluzole (RILUTEK) 50 MG tablet Take 50 mg by mouth every 12  (twelve) hours.    Marland Kitchen amLODipine (NORVASC) 10 MG tablet Take 10 mg by mouth daily with breakfast.     . aspirin 325 MG tablet Take 325 mg by mouth daily.    . Calcium-Vitamin D (CALTRATE 600 PLUS-VIT D PO) Take 1 capsule by mouth 2 (two) times daily.    . Glucosamine-Chondroit-Vit C-Mn (GLUCOSAMINE CHONDR 1500 COMPLX PO) Take 1 tablet by mouth daily.     Marland Kitchen levothyroxine (SYNTHROID, LEVOTHROID) 75 MCG tablet Take 75 mcg by mouth daily before breakfast.     . loperamide (IMODIUM A-D) 2 MG tablet Take 2 mg by mouth daily with breakfast.     . omeprazole (PRILOSEC) 20 MG capsule Take 20 mg by mouth daily with breakfast.     . ONE TOUCH ULTRA TEST test strip     . pravastatin (PRAVACHOL) 40 MG tablet Take 80 mg by mouth every evening.     . psyllium (METAMUCIL) 58.6 % powder Take 1 packet by mouth 2 (two) times daily.    Marland Kitchen sulfamethoxazole-trimethoprim (BACTRIM,SEPTRA) 200-40 MG/5ML suspension Take by mouth 2 (two) times daily.     No current facility-administered medications for this visit.    PHYSICAL EXAMINATION: ECOG PERFORMANCE STATUS: 2 - Symptomatic, <50% confined to bed  Filed Vitals:   02/13/16 1051 02/13/16 1112  BP: 194/48 154/63  Pulse: 46 84  Temp: 98.1 F (36.7 C)   Resp: 20    Filed Weights   02/13/16 1051  Weight: 167 lb 3.2 oz (75.841 kg)    GENERAL:alert, no distress and comfortable SKIN: skin color, texture, turgor are normal, no rashes or significant lesions EYES: normal, Conjunctiva are pink and non-injected, sclera clear Musculoskeletal:no cyanosis of digits and no clubbing  NEURO: alert & oriented x 3 with fluent speech,  With persistent weakness  LABORATORY DATA:  I have reviewed the data as listed    Component Value Date/Time   NA 145 02/06/2016 1106   NA 143 07/07/2012 1256   K 4.0 02/06/2016 1106   K 4.2 07/07/2012 1256   CL 106 04/30/2013 1305   CL 108 07/07/2012 1256   CO2 25 02/06/2016 1106   CO2 26 07/07/2012 1256   GLUCOSE 110 02/06/2016  1106   GLUCOSE 81 04/30/2013 1305   GLUCOSE 118* 07/07/2012 1256   BUN 23.8 02/06/2016 1106   BUN 35* 07/07/2012 1256   CREATININE 0.8 02/06/2016 1106   CREATININE 1.28* 07/07/2012 1256   CALCIUM 9.3 02/06/2016 1106   CALCIUM 9.3 07/07/2012 1256   PROT 6.7 02/06/2016 1106   PROT 6.2 02/06/2016 1106   PROT 6.6 07/07/2012 1256   ALBUMIN 3.6 02/06/2016 1106   ALBUMIN 4.0 07/07/2012 1256   AST 20 02/06/2016 1106   AST 19 07/07/2012 1256   ALT 14 02/06/2016 1106   ALT 12 07/07/2012 1256   ALKPHOS 77 02/06/2016 1106   ALKPHOS 62 07/07/2012 1256   BILITOT 0.37 02/06/2016 1106   BILITOT 0.4 07/07/2012 1256    No results found for: SPEP, UPEP  Lab Results  Component Value Date   WBC 6.1 02/06/2016   NEUTROABS 4.0 02/06/2016   HGB 10.7* 02/06/2016   HCT 33.3* 02/06/2016   MCV 91.0 02/06/2016   PLT 160 02/06/2016      Chemistry      Component Value Date/Time   NA 145 02/06/2016 1106   NA 143 07/07/2012 1256   K 4.0 02/06/2016 1106   K 4.2 07/07/2012 1256   CL 106 04/30/2013 1305   CL 108 07/07/2012 1256   CO2 25 02/06/2016 1106   CO2 26 07/07/2012 1256   BUN 23.8 02/06/2016 1106   BUN 35* 07/07/2012 1256   CREATININE 0.8 02/06/2016 1106   CREATININE 1.28* 07/07/2012 1256      Component Value Date/Time   CALCIUM 9.3 02/06/2016 1106   CALCIUM 9.3 07/07/2012 1256   ALKPHOS 77 02/06/2016 1106   ALKPHOS 62 07/07/2012 1256   AST 20 02/06/2016 1106   AST 19 07/07/2012 1256   ALT 14 02/06/2016 1106   ALT 12 07/07/2012 1256   BILITOT 0.37 02/06/2016 1106   BILITOT 0.4 07/07/2012 1256      ASSESSMENT & PLAN:  MGUS (monoclonal gammopathy of unknown significance) She has very mild worsening light chain that overall is not consistent with significant disease progression I recommend repeat blood work history and physical examination in 6 months     Anemia in chronic renal disease This is likely anemia of chronic disease. The patient denies recent history of bleeding  such as epistaxis, hematuria or hematochezia. She is asymptomatic from the anemia. We will observe for now.     ALS (amyotrophic lateral sclerosis) [facility])  She is currently receiving treatment through [facility]. I will defer to them for further management  Essential hypertension she will continue current medical management. I recommend close follow-up with primary care doctor for medication adjustment.    Orders Placed This Encounter  Procedures  . DG Bone Survey Met    Standing Status: Future     Number of Occurrences:      Standing Expiration Date: 04/14/2017    Order Specific Question:  Reason for Exam (SYMPTOM  OR DIAGNOSIS REQUIRED)    Answer:  staging myeloma    Order Specific Question:  Preferred imaging location?  Answer:  [facility]  . CBC with Differential/Platelet    Standing Status: Future     Number of Occurrences:      Standing Expiration Date: 04/14/2017  . Comprehensive metabolic panel    Standing Status: Future     Number of Occurrences:      Standing Expiration Date: 04/14/2017  . Multiple Myeloma Panel (SPEP&IFE w/QIG)    Standing Status: Future     Number of Occurrences:      Standing Expiration Date: 04/14/2017  . Kappa/lambda light chains    Standing Status: Future     Number of Occurrences:      Standing Expiration Date: 04/14/2017   All questions were answered. The patient knows to call the clinic with any problems, questions or concerns. No barriers to learning was detected. I spent 15 minutes counseling the patient face to face. The total time spent in the appointment was 20 minutes and more than 50% was on counseling and review of test results     [facility], Adilson Grafton, MD 02/13/2016 11:24 AM

## 2016-02-13 NOTE — Assessment & Plan Note (Signed)
This is likely anemia of chronic disease. The patient denies recent history of bleeding such as epistaxis, hematuria or hematochezia. She is asymptomatic from the anemia. We will observe for now.  

## 2016-02-13 NOTE — Telephone Encounter (Signed)
Pt confirmed labs/ov per 02/17 POF, gave pt AVS and Calendar... KJ

## 2016-02-13 NOTE — Assessment & Plan Note (Signed)
She is currently receiving treatment through [facility]. I will defer to them for further management

## 2016-02-13 NOTE — Assessment & Plan Note (Signed)
she will continue current medical management. I recommend close follow-up with primary care doctor for medication adjustment.  

## 2016-04-20 ENCOUNTER — Ambulatory Visit: Payer: Medicare HMO | Admitting: Neurology

## 2016-08-06 ENCOUNTER — Other Ambulatory Visit (HOSPITAL_BASED_OUTPATIENT_CLINIC_OR_DEPARTMENT_OTHER): Payer: Medicare HMO

## 2016-08-06 ENCOUNTER — Ambulatory Visit (HOSPITAL_COMMUNITY)
Admission: RE | Admit: 2016-08-06 | Discharge: 2016-08-06 | Disposition: A | Discharge status: Home / Self Care | Payer: Medicare HMO | Source: Ambulatory Visit | Attending: Hematology and Oncology | Admitting: Hematology and Oncology

## 2016-08-06 DIAGNOSIS — M4856XA Collapsed vertebra, not elsewhere classified, lumbar region, initial encounter for fracture: Secondary | ICD-10-CM | POA: Diagnosis not present

## 2016-08-06 DIAGNOSIS — I700 Atherosclerosis of aorta: Secondary | ICD-10-CM | POA: Insufficient documentation

## 2016-08-06 DIAGNOSIS — X58XXXA Exposure to other specified factors, initial encounter: Secondary | ICD-10-CM | POA: Diagnosis not present

## 2016-08-06 DIAGNOSIS — D472 Monoclonal gammopathy: Secondary | ICD-10-CM | POA: Diagnosis not present

## 2016-08-06 LAB — COMPREHENSIVE METABOLIC PANEL
ALK PHOS: 71 U/L (ref 40–150)
ALT: 13 U/L (ref 0–55)
ANION GAP: 10 meq/L (ref 3–11)
AST: 19 U/L (ref 5–34)
Albumin: 3.5 g/dL (ref 3.5–5.0)
BUN: 23.8 mg/dL (ref 7.0–26.0)
CO2: 26 meq/L (ref 22–29)
Calcium: 9.8 mg/dL (ref 8.4–10.4)
Chloride: 108 mEq/L (ref 98–109)
Creatinine: 0.8 mg/dL (ref 0.6–1.1)
EGFR: 71 mL/min/{1.73_m2} — AB (ref >90)
GLUCOSE: 78 mg/dL (ref 70–140)
POTASSIUM: 4.1 meq/L (ref 3.5–5.1)
SODIUM: 145 meq/L (ref 136–145)
Total Bilirubin: 0.46 mg/dL (ref 0.20–1.20)
Total Protein: 6.8 g/dL (ref 6.4–8.3)

## 2016-08-06 LAB — CBC WITH DIFFERENTIAL/PLATELET
BASO%: 0.7 % (ref 0.0–2.0)
BASOS ABS: 0.1 10*3/uL (ref 0.0–0.1)
EOS ABS: 0.2 10*3/uL (ref 0.0–0.5)
EOS%: 2.2 % (ref 0.0–7.0)
HCT: 33.1 % — ABNORMAL LOW (ref 34.8–46.6)
HGB: 10.9 g/dL — ABNORMAL LOW (ref 11.6–15.9)
LYMPH%: 19.7 % (ref 14.0–49.7)
MCH: 30.9 pg (ref 25.1–34.0)
MCHC: 33 g/dL (ref 31.5–36.0)
MCV: 93.8 fL (ref 79.5–101.0)
MONO#: 0.6 10*3/uL (ref 0.1–0.9)
MONO%: 7.9 % (ref 0.0–14.0)
NEUT#: 5.6 10*3/uL (ref 1.5–6.5)
NEUT%: 69.5 % (ref 38.4–76.8)
Platelets: 178 10*3/uL (ref 145–400)
RBC: 3.53 10*6/uL — AB (ref 3.70–5.45)
RDW: 14.2 % (ref 11.2–14.5)
WBC: 8.1 10*3/uL (ref 3.9–10.3)
lymph#: 1.6 10*3/uL (ref 0.9–3.3)

## 2016-08-09 LAB — KAPPA/LAMBDA LIGHT CHAINS
IG KAPPA FREE LIGHT CHAIN: 881.6 mg/L — AB (ref 3.3–19.4)
IG LAMBDA FREE LIGHT CHAIN: 26.7 mg/L — AB (ref 5.7–26.3)
KAPPA/LAMBDA FLC RATIO: 33.02 — AB (ref 0.26–1.65)

## 2016-08-10 LAB — MULTIPLE MYELOMA PANEL, SERUM
Albumin SerPl Elph-Mcnc: 3.7 g/dL (ref 2.9–4.4)
Albumin/Glob SerPl: 1.5 (ref 0.7–1.7)
Alpha 1: 0.3 g/dL (ref 0.0–0.4)
Alpha2 Glob SerPl Elph-Mcnc: 0.8 g/dL (ref 0.4–1.0)
B-GLOBULIN SERPL ELPH-MCNC: 1 g/dL (ref 0.7–1.3)
Gamma Glob SerPl Elph-Mcnc: 0.5 g/dL (ref 0.4–1.8)
Globulin, Total: 2.5 g/dL (ref 2.2–3.9)
IGA/IMMUNOGLOBULIN A, SERUM: 139 mg/dL (ref 64–422)
IGM (IMMUNOGLOBIN M), SRM: 16 mg/dL — AB (ref 26–217)
IgG, Qn, Serum: 635 mg/dL — ABNORMAL LOW (ref 700–1600)
TOTAL PROTEIN: 6.2 g/dL (ref 6.0–8.5)

## 2016-08-13 ENCOUNTER — Encounter: Payer: Self-pay | Admitting: Hematology and Oncology

## 2016-08-13 ENCOUNTER — Telehealth: Payer: Self-pay | Admitting: Hematology and Oncology

## 2016-08-13 ENCOUNTER — Ambulatory Visit (HOSPITAL_BASED_OUTPATIENT_CLINIC_OR_DEPARTMENT_OTHER): Payer: Medicare HMO | Admitting: Hematology and Oncology

## 2016-08-13 VITALS — BP 175/52 | HR 73 | Temp 97.9°F | Resp 18 | Ht 63.0 in | Wt 161.3 lb

## 2016-08-13 DIAGNOSIS — D631 Anemia in chronic kidney disease: Secondary | ICD-10-CM

## 2016-08-13 DIAGNOSIS — I10 Essential (primary) hypertension: Secondary | ICD-10-CM

## 2016-08-13 DIAGNOSIS — N189 Chronic kidney disease, unspecified: Secondary | ICD-10-CM

## 2016-08-13 DIAGNOSIS — D472 Monoclonal gammopathy: Secondary | ICD-10-CM | POA: Diagnosis not present

## 2016-08-13 DIAGNOSIS — G1221 Amyotrophic lateral sclerosis: Secondary | ICD-10-CM | POA: Diagnosis not present

## 2016-08-13 NOTE — Assessment & Plan Note (Signed)
She has stable increased free light chains that overall is not consistent with significant disease progression I recommend repeat blood work history and physical examination in 6 months

## 2016-08-13 NOTE — Progress Notes (Signed)
[place] OFFICE PROGRESS NOTE  Patient Care Team: Adron Bene, PA-C as PCP - General (Physician Assistant) Edrick Oh, MD (Nephrology) Rochel Brome, MD as Referring Physician (Family Medicine)  SUMMARY OF ONCOLOGIC HISTORY:  DIAGNOSIS: Kappa light chain monoclonal gammopathy of unknown significance  SUMMARY OF ONCOLOGIC HISTORY: This patient was referred here because of abnormal blood work. She was noted to have monoclonal gammopathy of unknown significance, with predominant light chain secretion. In July 2013 she had bone marrow biopsy which only showed 2% plasma cells. She is being observed.  INTERVAL HISTORY: Please see below for problem oriented charting. She complained of progressive weakness with inability to elevate above her shoulder and bend her elbows. She has seen neurologists and underwent extensive evaluation with multiple imaging studies and is subsequently diagnosed with ALS  She denies recurrent infection. She denies new bone pain  REVIEW OF SYSTEMS:   Constitutional: Denies fevers, chills or abnormal weight loss Eyes: Denies blurriness of vision Ears, nose, mouth, throat, and face: Denies mucositis or sore throat Respiratory: Denies cough, dyspnea or wheezes Cardiovascular: Denies palpitation, chest discomfort or lower extremity swelling Gastrointestinal:  Denies nausea, heartburn or change in bowel habits Skin: Denies abnormal skin rashes Lymphatics: Denies new lymphadenopathy or easy bruising Neurological:Denies numbness, tingling or new weaknesses Behavioral/Psych: Mood is stable, no new changes  All other systems were reviewed with the patient and are negative.  I have reviewed the past medical history, past surgical history, social history and family history with the patient and they are unchanged from previous note.  ALLERGIES:  is allergic to penicillins.  MEDICATIONS:  Current Outpatient Prescriptions  Medication Sig Dispense Refill   . amLODipine (NORVASC) 10 MG tablet Take 10 mg by mouth daily with breakfast.     . aspirin 325 MG tablet Take 325 mg by mouth daily.    . Calcium-Vitamin D (CALTRATE 600 PLUS-VIT D PO) Take 1 capsule by mouth 2 (two) times daily.    . Glucosamine-Chondroit-Vit C-Mn (GLUCOSAMINE CHONDR 1500 COMPLX PO) Take 1 tablet by mouth daily.     Marland Kitchen levothyroxine (SYNTHROID, LEVOTHROID) 75 MCG tablet Take 75 mcg by mouth daily before breakfast.     . loperamide (IMODIUM A-D) 2 MG tablet Take 2 mg by mouth daily with breakfast.     . omeprazole (PRILOSEC) 20 MG capsule Take 20 mg by mouth daily with breakfast.     . ONE TOUCH ULTRA TEST test strip     . pravastatin (PRAVACHOL) 40 MG tablet Take 80 mg by mouth every evening.     . psyllium (METAMUCIL) 58.6 % powder Take 1 packet by mouth 2 (two) times daily.    . riluzole (RILUTEK) 50 MG tablet Take 50 mg by mouth every 12 (twelve) hours.     No current facility-administered medications for this visit.     PHYSICAL EXAMINATION: ECOG PERFORMANCE STATUS: 2 - Symptomatic, <50% confined to bed  Vitals:   08/13/16 1054  BP: (!) 175/52  Pulse: 73  Resp: 18  Temp: 97.9 F (36.6 C)   Filed Weights   08/13/16 1054  Weight: 161 lb 4.8 oz (73.2 kg)    GENERAL:alert, no distress and comfortable. She is sitting on a wheelchair SKIN: skin color, texture, turgor are normal, no rashes or significant lesions EYES: normal, Conjunctiva are pink and non-injected, sclera clear OROPHARYNX:no exudate, no erythema and lips, buccal mucosa, and tongue normal  NECK: supple, thyroid normal size, non-tender, without nodularity LYMPH:  no palpable lymphadenopathy in  the cervical, axillary or inguinal LUNGS: clear to auscultation and percussion with normal breathing effort HEART: regular rate & rhythm and no murmurs and no lower extremity edema ABDOMEN:abdomen soft, non-tender and normal bowel sounds Musculoskeletal:no cyanosis of digits and no clubbing  NEURO: alert &  oriented x 3 with fluent speech  LABORATORY DATA:  I have reviewed the data as listed    Component Value Date/Time   NA 145 08/06/2016 1042   K 4.1 08/06/2016 1042   CL 106 04/30/2013 1305   CO2 26 08/06/2016 1042   GLUCOSE 78 08/06/2016 1042   GLUCOSE 81 04/30/2013 1305   BUN 23.8 08/06/2016 1042   CREATININE 0.8 08/06/2016 1042   CALCIUM 9.8 08/06/2016 1042   PROT 6.2 08/06/2016 1042   PROT 6.8 08/06/2016 1042   ALBUMIN 3.5 08/06/2016 1042   AST 19 08/06/2016 1042   ALT 13 08/06/2016 1042   ALKPHOS 71 08/06/2016 1042   BILITOT 0.46 08/06/2016 1042    No results found for: SPEP, UPEP  Lab Results  Component Value Date   WBC 8.1 08/06/2016   NEUTROABS 5.6 08/06/2016   HGB 10.9 (L) 08/06/2016   HCT 33.1 (L) 08/06/2016   MCV 93.8 08/06/2016   PLT 178 08/06/2016      Chemistry      Component Value Date/Time   NA 145 08/06/2016 1042   K 4.1 08/06/2016 1042   CL 106 04/30/2013 1305   CO2 26 08/06/2016 1042   BUN 23.8 08/06/2016 1042   CREATININE 0.8 08/06/2016 1042      Component Value Date/Time   CALCIUM 9.8 08/06/2016 1042   ALKPHOS 71 08/06/2016 1042   AST 19 08/06/2016 1042   ALT 13 08/06/2016 1042   BILITOT 0.46 08/06/2016 1042      ASSESSMENT & PLAN:  MGUS (monoclonal gammopathy of unknown significance) She has stable increased free light chains that overall is not consistent with significant disease progression I recommend repeat blood work history and physical examination in 6 months   Anemia in chronic renal disease This is likely anemia of chronic disease. The patient denies recent history of bleeding such as epistaxis, hematuria or hematochezia. She is asymptomatic from the anemia. We will observe for now.     Essential hypertension she will continue current medical management. I recommend close follow-up with primary care doctor for medication adjustment.   ALS (amyotrophic lateral sclerosis) [facility])  She is currently receiving treatment  through [facility]. I will defer to them for further management The patient is getting quite debilitated. We discussed the importance of advanced directives   Orders Placed This Encounter  Procedures  . CBC with Differential/Platelet    Standing Status:   Future    Standing Expiration Date:   09/17/2017  . Comprehensive metabolic panel    Standing Status:   Future    Standing Expiration Date:   09/17/2017  . Kappa/lambda light chains    Standing Status:   Future    Standing Expiration Date:   09/17/2017  . Multiple Myeloma Panel (SPEP&IFE w/QIG)    Standing Status:   Future    Standing Expiration Date:   09/17/2017  . Beta 2 microglobulin, serum    Standing Status:   Future    Standing Expiration Date:   09/17/2017   All questions were answered. The patient knows to call the clinic with any problems, questions or concerns. No barriers to learning was detected. I spent 15 minutes counseling the patient face to face.  The total time spent in the appointment was 20 minutes and more than 50% was on counseling and review of test results     [facility], [place], MD 08/13/2016 11:16 AM

## 2016-08-13 NOTE — Telephone Encounter (Signed)
Gave pt cal & avs °

## 2016-08-13 NOTE — Assessment & Plan Note (Signed)
She is currently receiving treatment through [facility]. I will defer to them for further management The patient is getting quite debilitated. We discussed the importance of advanced directives

## 2016-08-13 NOTE — Assessment & Plan Note (Signed)
she will continue current medical management. I recommend close follow-up with primary care doctor for medication adjustment.  

## 2016-08-13 NOTE — Assessment & Plan Note (Signed)
This is likely anemia of chronic disease. The patient denies recent history of bleeding such as epistaxis, hematuria or hematochezia. She is asymptomatic from the anemia. We will observe for now.  

## 2016-11-16 IMAGING — MR MR HEAD W/O CM
8 series · 37 of 48 positions shown · non-contrast
Comparison: 04/07/2014 head CT.  No comparison brain MR.

CLINICAL DATA: [age] year old hypertensive diabetic female
with ataxia and weakness for 6 months. Left arm weakness and
difficulty walking. History of monoclonal gammopathy and ovarian
cancer. Initial encounter.

EXAM:
MRI HEAD WITHOUT CONTRAST
TECHNIQUE: Multiplanar, multiecho pulse sequences of the brain and surrounding
structures were obtained without intravenous contrast.

[Series 2: T1 · sagittal · 5.0mm · 0.45mm/px · 3 of 19 slices shown]
[im 1/19]
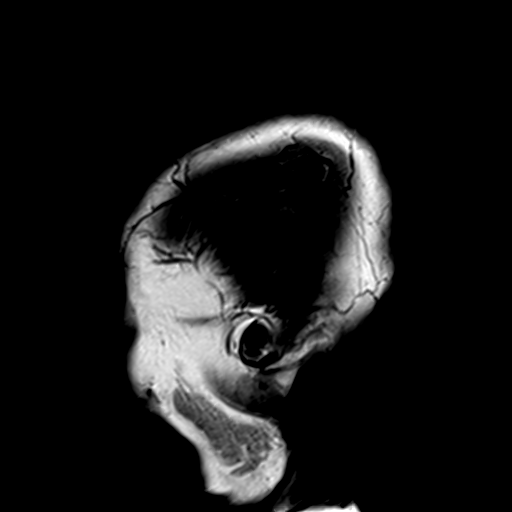
[im 10/19]
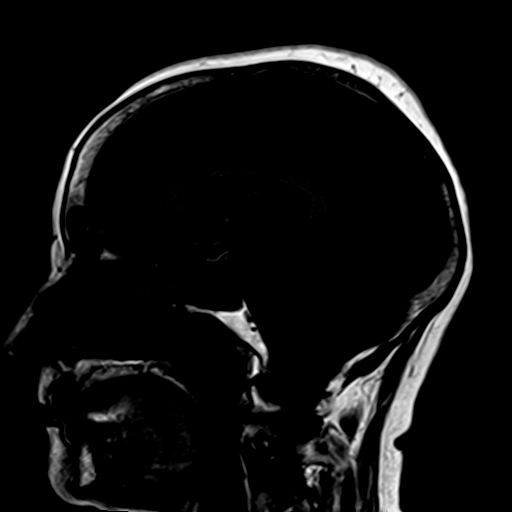
[im 19/19]
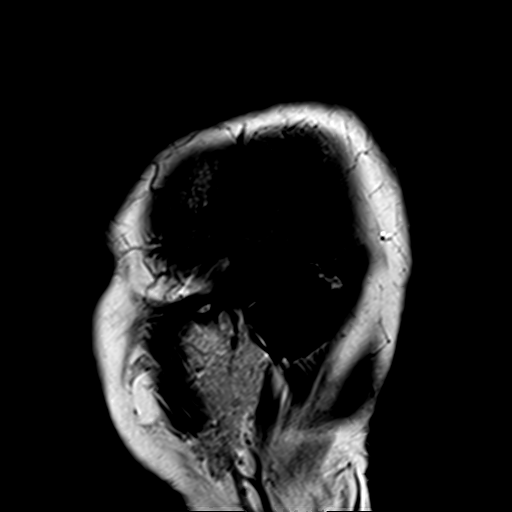

[Series 3: DWI · axial · 3.0mm · 0.90mm/px · z∈[-74,+73]mm · 9 of 100 slices shown]
[im 1/100]
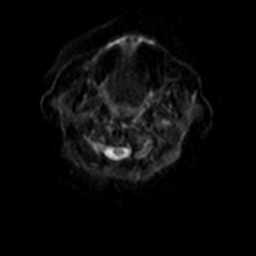
[im 17/100]
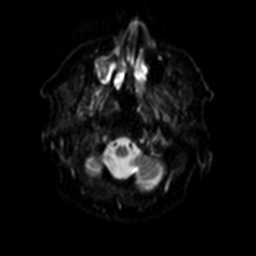
[im 34/100]
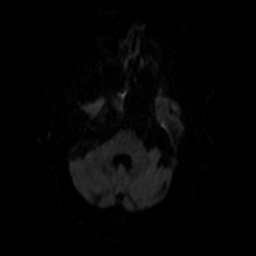
[im 42/100]
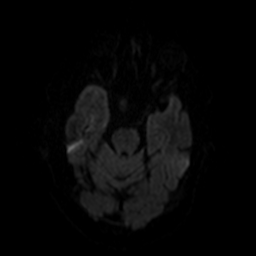
[im 50/100]
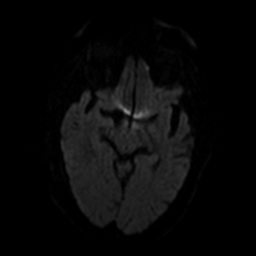
[im 58/100]
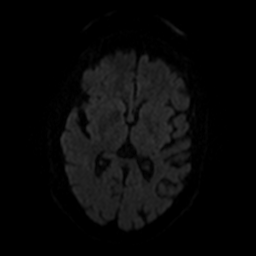
[im 67/100]
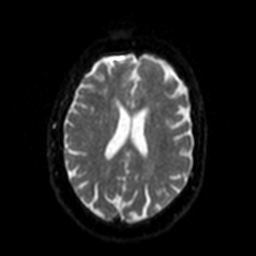
[im 83/100]
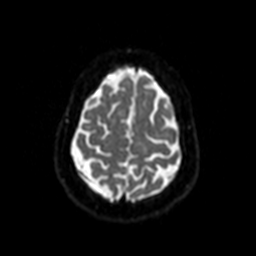
[im 100/100]
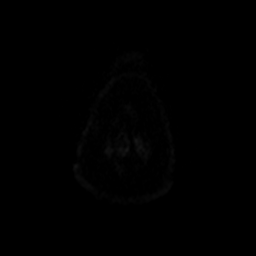

[Series 4: dwi_adc · axial · 3.0mm · 0.90mm/px · z∈[-74,+73]mm · 6 of 49 slices shown]
[im 1/49]
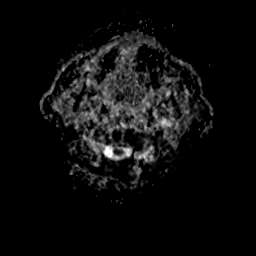
[im 10/49]
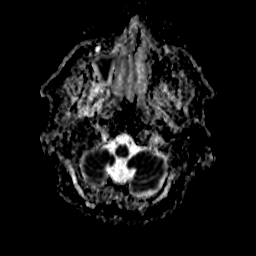
[im 20/49]
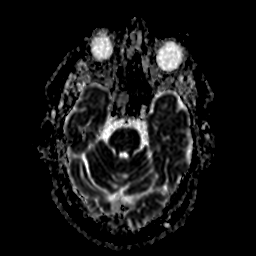
[im 29/49]
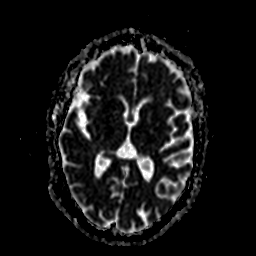
[im 39/49]
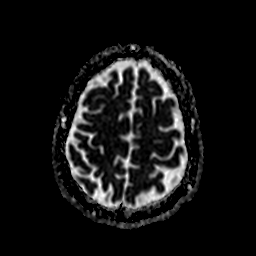
[im 49/49]
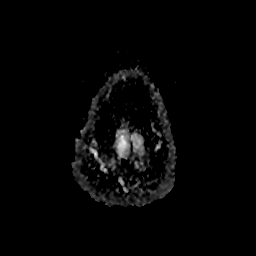

[Series 5: FLAIR · axial · 5.0mm · 0.45mm/px · z∈[-73,+75]mm · 3 of 24 slices shown]
[im 1/24]
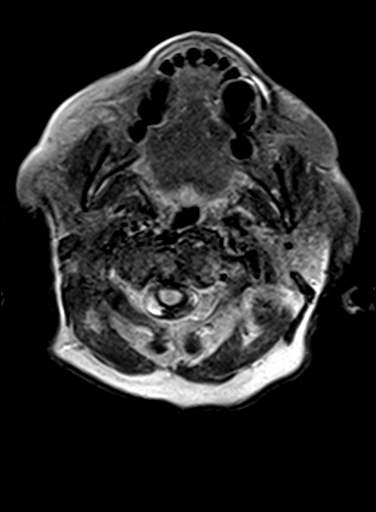
[im 12/24]
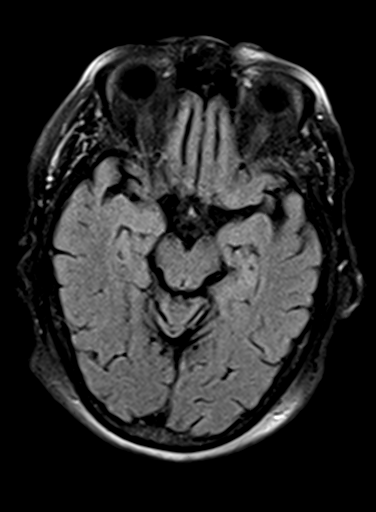
[im 24/24]
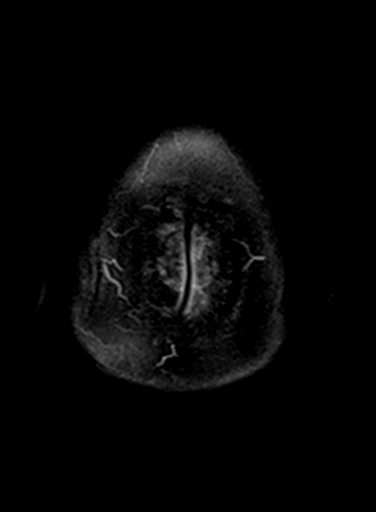

[Series 6: T2 · axial · 5.0mm · 0.45mm/px · z∈[-73,+76]mm · 3 of 24 slices shown (1 of 2)]
[im 1/24]
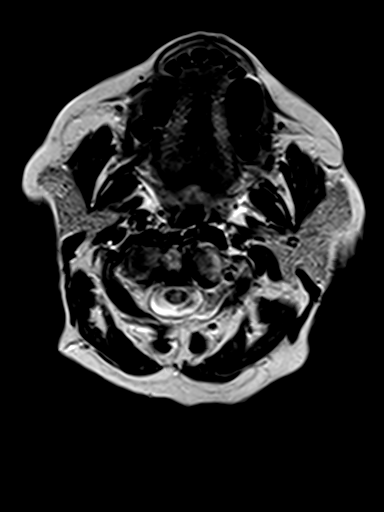
[im 12/24]
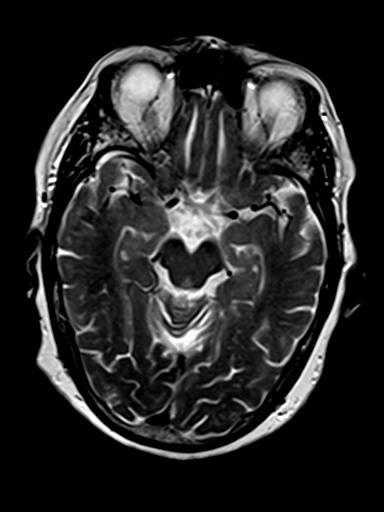
[im 24/24]
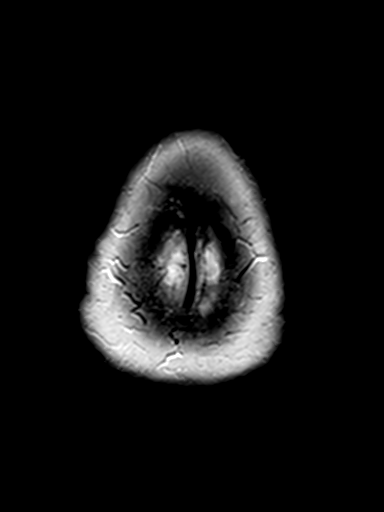

[Series 8: swi_images · axial · 2.2mm · 0.90mm/px · z∈[-64,+68]mm · 8 of 60 slices shown]
[im 1/60]
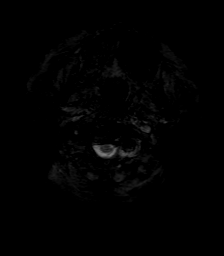
[im 9/60]
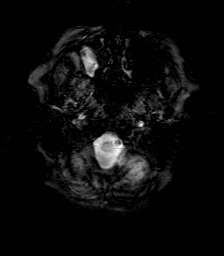
[im 17/60]
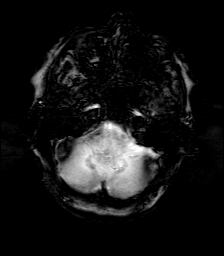
[im 26/60]
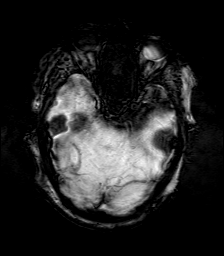
[im 34/60]
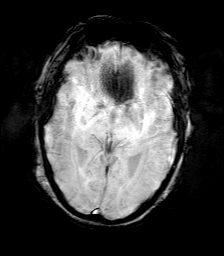
[im 43/60]
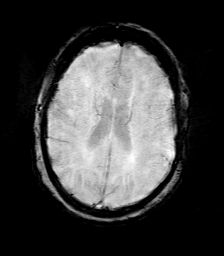
[im 51/60]
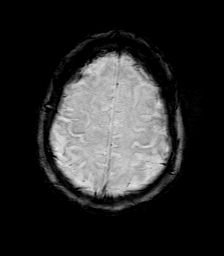
[im 60/60]
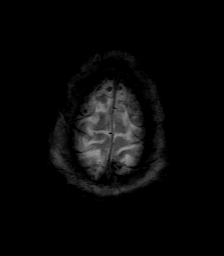

[Series 9: axial (person_name)1 volume · axial · 2.0mm · 0.45mm/px · z∈[-70,-54]mm · 2 of 72 slices shown]
[im 1/72]
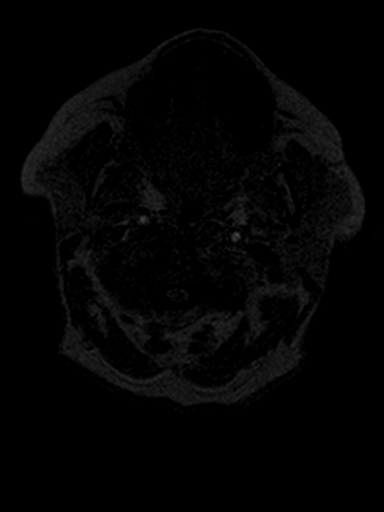
[im 9/72]
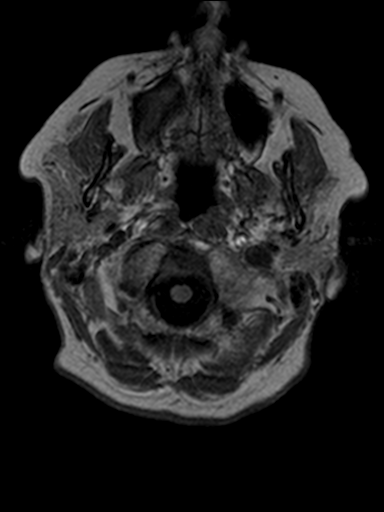

[Series 10: T2 · coronal · 5.0mm · 0.41mm/px · 3 of 24 slices shown (2 of 2)]
[im 1/24]
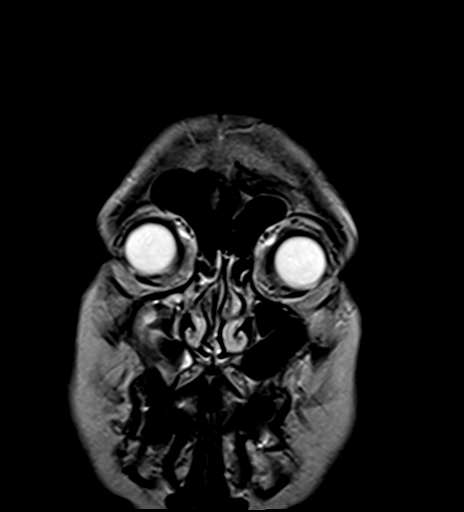
[im 12/24]
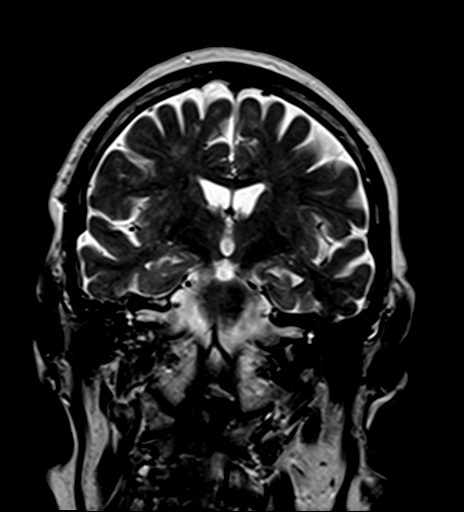
[im 24/24]
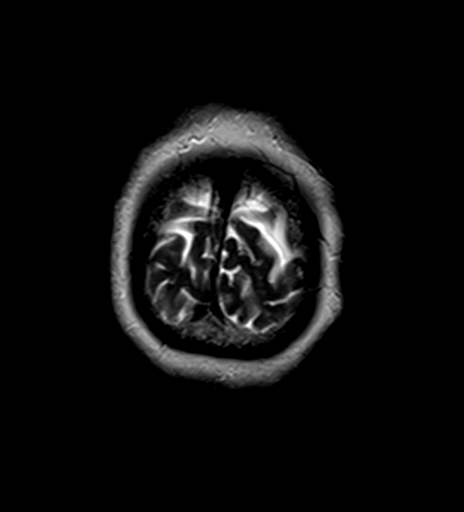

[37 of 48 positions shown; findings below may reference images not displayed]

FINDINGS: No acute infarct.

Mild small vessel disease type changes.

No intracranial hemorrhage.

No evidence of age advanced atrophy or hydrocephalus. Incidentally
noted is a cyst of the cavum velum interpositum.

No intracranial mass lesion noted on this unenhanced exam.

Major intracranial vascular structures are patent.

Transverse ligament hypertrophy. Slight cranial settling of the C1
ring. Mild degenerative changes upper cervical spine with mild
spinal stenosis C2-3. Cervical medullary junction, pituitary region
and pineal region unremarkable.

Post lens replacement otherwise orbital structures unremarkable.

Complex opacification right maxillary sinus with mucosal thickening.
Complex opacification right sphenoid sinus which may represent
presence of inspissated proteinaceous material although fungal
disease could have a similar appearance in the proper clinical
setting.

Partial opacification mastoid air cells greater on the right without
obstructing lesion of the eustachian tube noted.
IMPRESSION: No acute infarct.

Mild small vessel disease type changes.

No evidence of age advanced atrophy or hydrocephalus. Incidentally
noted is a cyst of the cavum velum interpositum.

No intracranial mass lesion noted on this unenhanced exam.

Transverse ligament hypertrophy. Slight cranial settling of the C1
ring. Mild degenerative changes upper cervical spine with mild
spinal stenosis C2-3.

Complex opacification right maxillary sinus with mucosal thickening.
Complex opacification right sphenoid sinus which may represent
presence of inspissated proteinaceous material although fungal
disease could have a similar appearance in the proper clinical
setting.

Partial opacification mastoid air cells greater on the right without
obstructing lesion of the eustachian tube noted.

## 2017-01-07 DIAGNOSIS — H6121 Impacted cerumen, right ear: Secondary | ICD-10-CM | POA: Diagnosis not present

## 2017-01-07 DIAGNOSIS — G1221 Amyotrophic lateral sclerosis: Secondary | ICD-10-CM | POA: Diagnosis not present

## 2017-01-07 DIAGNOSIS — H10022 Other mucopurulent conjunctivitis, left eye: Secondary | ICD-10-CM | POA: Diagnosis not present

## 2017-01-07 DIAGNOSIS — J208 Acute bronchitis due to other specified organisms: Secondary | ICD-10-CM | POA: Diagnosis not present

## 2017-01-11 DIAGNOSIS — D485 Neoplasm of uncertain behavior of skin: Secondary | ICD-10-CM | POA: Diagnosis not present

## 2017-01-11 DIAGNOSIS — L570 Actinic keratosis: Secondary | ICD-10-CM | POA: Diagnosis not present

## 2017-01-14 ENCOUNTER — Telehealth: Payer: Self-pay | Admitting: Hematology and Oncology

## 2017-01-14 NOTE — Telephone Encounter (Signed)
Mailed pt records to [facility] risk adjustment

## 2017-01-27 DIAGNOSIS — I129 Hypertensive chronic kidney disease with stage 1 through stage 4 chronic kidney disease, or unspecified chronic kidney disease: Secondary | ICD-10-CM | POA: Diagnosis not present

## 2017-01-27 DIAGNOSIS — N182 Chronic kidney disease, stage 2 (mild): Secondary | ICD-10-CM | POA: Diagnosis not present

## 2017-01-27 DIAGNOSIS — Z9181 History of falling: Secondary | ICD-10-CM | POA: Diagnosis not present

## 2017-01-27 DIAGNOSIS — Z602 Problems related to living alone: Secondary | ICD-10-CM | POA: Diagnosis not present

## 2017-01-27 DIAGNOSIS — E890 Postprocedural hypothyroidism: Secondary | ICD-10-CM | POA: Diagnosis not present

## 2017-01-27 DIAGNOSIS — G1221 Amyotrophic lateral sclerosis: Secondary | ICD-10-CM | POA: Diagnosis not present

## 2017-01-27 DIAGNOSIS — Z7982 Long term (current) use of aspirin: Secondary | ICD-10-CM | POA: Diagnosis not present

## 2017-01-27 DIAGNOSIS — D485 Neoplasm of uncertain behavior of skin: Secondary | ICD-10-CM | POA: Diagnosis not present

## 2017-01-27 DIAGNOSIS — E1122 Type 2 diabetes mellitus with diabetic chronic kidney disease: Secondary | ICD-10-CM | POA: Diagnosis not present

## 2017-01-27 DIAGNOSIS — J45909 Unspecified asthma, uncomplicated: Secondary | ICD-10-CM | POA: Diagnosis not present

## 2017-01-31 DIAGNOSIS — I129 Hypertensive chronic kidney disease with stage 1 through stage 4 chronic kidney disease, or unspecified chronic kidney disease: Secondary | ICD-10-CM | POA: Diagnosis not present

## 2017-01-31 DIAGNOSIS — D485 Neoplasm of uncertain behavior of skin: Secondary | ICD-10-CM | POA: Diagnosis not present

## 2017-01-31 DIAGNOSIS — E890 Postprocedural hypothyroidism: Secondary | ICD-10-CM | POA: Diagnosis not present

## 2017-01-31 DIAGNOSIS — N182 Chronic kidney disease, stage 2 (mild): Secondary | ICD-10-CM | POA: Diagnosis not present

## 2017-01-31 DIAGNOSIS — Z7982 Long term (current) use of aspirin: Secondary | ICD-10-CM | POA: Diagnosis not present

## 2017-01-31 DIAGNOSIS — Z9181 History of falling: Secondary | ICD-10-CM | POA: Diagnosis not present

## 2017-01-31 DIAGNOSIS — Z602 Problems related to living alone: Secondary | ICD-10-CM | POA: Diagnosis not present

## 2017-01-31 DIAGNOSIS — G1221 Amyotrophic lateral sclerosis: Secondary | ICD-10-CM | POA: Diagnosis not present

## 2017-01-31 DIAGNOSIS — E1122 Type 2 diabetes mellitus with diabetic chronic kidney disease: Secondary | ICD-10-CM | POA: Diagnosis not present

## 2017-01-31 DIAGNOSIS — J45909 Unspecified asthma, uncomplicated: Secondary | ICD-10-CM | POA: Diagnosis not present

## 2017-02-01 DIAGNOSIS — J45909 Unspecified asthma, uncomplicated: Secondary | ICD-10-CM | POA: Diagnosis not present

## 2017-02-01 DIAGNOSIS — Z602 Problems related to living alone: Secondary | ICD-10-CM | POA: Diagnosis not present

## 2017-02-01 DIAGNOSIS — N182 Chronic kidney disease, stage 2 (mild): Secondary | ICD-10-CM | POA: Diagnosis not present

## 2017-02-01 DIAGNOSIS — Z7982 Long term (current) use of aspirin: Secondary | ICD-10-CM | POA: Diagnosis not present

## 2017-02-01 DIAGNOSIS — Z9181 History of falling: Secondary | ICD-10-CM | POA: Diagnosis not present

## 2017-02-01 DIAGNOSIS — I129 Hypertensive chronic kidney disease with stage 1 through stage 4 chronic kidney disease, or unspecified chronic kidney disease: Secondary | ICD-10-CM | POA: Diagnosis not present

## 2017-02-01 DIAGNOSIS — E1122 Type 2 diabetes mellitus with diabetic chronic kidney disease: Secondary | ICD-10-CM | POA: Diagnosis not present

## 2017-02-01 DIAGNOSIS — E890 Postprocedural hypothyroidism: Secondary | ICD-10-CM | POA: Diagnosis not present

## 2017-02-01 DIAGNOSIS — D485 Neoplasm of uncertain behavior of skin: Secondary | ICD-10-CM | POA: Diagnosis not present

## 2017-02-01 DIAGNOSIS — G1221 Amyotrophic lateral sclerosis: Secondary | ICD-10-CM | POA: Diagnosis not present

## 2017-02-03 DIAGNOSIS — G1221 Amyotrophic lateral sclerosis: Secondary | ICD-10-CM | POA: Diagnosis not present

## 2017-02-03 DIAGNOSIS — J45909 Unspecified asthma, uncomplicated: Secondary | ICD-10-CM | POA: Diagnosis not present

## 2017-02-03 DIAGNOSIS — Z7982 Long term (current) use of aspirin: Secondary | ICD-10-CM | POA: Diagnosis not present

## 2017-02-03 DIAGNOSIS — E890 Postprocedural hypothyroidism: Secondary | ICD-10-CM | POA: Diagnosis not present

## 2017-02-03 DIAGNOSIS — Z9181 History of falling: Secondary | ICD-10-CM | POA: Diagnosis not present

## 2017-02-03 DIAGNOSIS — D485 Neoplasm of uncertain behavior of skin: Secondary | ICD-10-CM | POA: Diagnosis not present

## 2017-02-03 DIAGNOSIS — Z602 Problems related to living alone: Secondary | ICD-10-CM | POA: Diagnosis not present

## 2017-02-03 DIAGNOSIS — E1122 Type 2 diabetes mellitus with diabetic chronic kidney disease: Secondary | ICD-10-CM | POA: Diagnosis not present

## 2017-02-03 DIAGNOSIS — N182 Chronic kidney disease, stage 2 (mild): Secondary | ICD-10-CM | POA: Diagnosis not present

## 2017-02-03 DIAGNOSIS — I129 Hypertensive chronic kidney disease with stage 1 through stage 4 chronic kidney disease, or unspecified chronic kidney disease: Secondary | ICD-10-CM | POA: Diagnosis not present

## 2017-02-07 DIAGNOSIS — E1122 Type 2 diabetes mellitus with diabetic chronic kidney disease: Secondary | ICD-10-CM | POA: Diagnosis not present

## 2017-02-07 DIAGNOSIS — N182 Chronic kidney disease, stage 2 (mild): Secondary | ICD-10-CM | POA: Diagnosis not present

## 2017-02-07 DIAGNOSIS — E890 Postprocedural hypothyroidism: Secondary | ICD-10-CM | POA: Diagnosis not present

## 2017-02-07 DIAGNOSIS — D485 Neoplasm of uncertain behavior of skin: Secondary | ICD-10-CM | POA: Diagnosis not present

## 2017-02-07 DIAGNOSIS — J45909 Unspecified asthma, uncomplicated: Secondary | ICD-10-CM | POA: Diagnosis not present

## 2017-02-07 DIAGNOSIS — Z9181 History of falling: Secondary | ICD-10-CM | POA: Diagnosis not present

## 2017-02-07 DIAGNOSIS — I129 Hypertensive chronic kidney disease with stage 1 through stage 4 chronic kidney disease, or unspecified chronic kidney disease: Secondary | ICD-10-CM | POA: Diagnosis not present

## 2017-02-07 DIAGNOSIS — Z7982 Long term (current) use of aspirin: Secondary | ICD-10-CM | POA: Diagnosis not present

## 2017-02-07 DIAGNOSIS — G1221 Amyotrophic lateral sclerosis: Secondary | ICD-10-CM | POA: Diagnosis not present

## 2017-02-07 DIAGNOSIS — Z602 Problems related to living alone: Secondary | ICD-10-CM | POA: Diagnosis not present

## 2017-02-08 DIAGNOSIS — Z9181 History of falling: Secondary | ICD-10-CM | POA: Diagnosis not present

## 2017-02-08 DIAGNOSIS — D485 Neoplasm of uncertain behavior of skin: Secondary | ICD-10-CM | POA: Diagnosis not present

## 2017-02-08 DIAGNOSIS — Z602 Problems related to living alone: Secondary | ICD-10-CM | POA: Diagnosis not present

## 2017-02-08 DIAGNOSIS — G1221 Amyotrophic lateral sclerosis: Secondary | ICD-10-CM | POA: Diagnosis not present

## 2017-02-08 DIAGNOSIS — N182 Chronic kidney disease, stage 2 (mild): Secondary | ICD-10-CM | POA: Diagnosis not present

## 2017-02-08 DIAGNOSIS — I129 Hypertensive chronic kidney disease with stage 1 through stage 4 chronic kidney disease, or unspecified chronic kidney disease: Secondary | ICD-10-CM | POA: Diagnosis not present

## 2017-02-08 DIAGNOSIS — J45909 Unspecified asthma, uncomplicated: Secondary | ICD-10-CM | POA: Diagnosis not present

## 2017-02-08 DIAGNOSIS — E1122 Type 2 diabetes mellitus with diabetic chronic kidney disease: Secondary | ICD-10-CM | POA: Diagnosis not present

## 2017-02-08 DIAGNOSIS — Z7982 Long term (current) use of aspirin: Secondary | ICD-10-CM | POA: Diagnosis not present

## 2017-02-08 DIAGNOSIS — E890 Postprocedural hypothyroidism: Secondary | ICD-10-CM | POA: Diagnosis not present

## 2017-02-10 DIAGNOSIS — Z7982 Long term (current) use of aspirin: Secondary | ICD-10-CM | POA: Diagnosis not present

## 2017-02-10 DIAGNOSIS — E890 Postprocedural hypothyroidism: Secondary | ICD-10-CM | POA: Diagnosis not present

## 2017-02-10 DIAGNOSIS — I129 Hypertensive chronic kidney disease with stage 1 through stage 4 chronic kidney disease, or unspecified chronic kidney disease: Secondary | ICD-10-CM | POA: Diagnosis not present

## 2017-02-10 DIAGNOSIS — E1122 Type 2 diabetes mellitus with diabetic chronic kidney disease: Secondary | ICD-10-CM | POA: Diagnosis not present

## 2017-02-10 DIAGNOSIS — D485 Neoplasm of uncertain behavior of skin: Secondary | ICD-10-CM | POA: Diagnosis not present

## 2017-02-10 DIAGNOSIS — Z9181 History of falling: Secondary | ICD-10-CM | POA: Diagnosis not present

## 2017-02-10 DIAGNOSIS — J45909 Unspecified asthma, uncomplicated: Secondary | ICD-10-CM | POA: Diagnosis not present

## 2017-02-10 DIAGNOSIS — N182 Chronic kidney disease, stage 2 (mild): Secondary | ICD-10-CM | POA: Diagnosis not present

## 2017-02-10 DIAGNOSIS — Z602 Problems related to living alone: Secondary | ICD-10-CM | POA: Diagnosis not present

## 2017-02-10 DIAGNOSIS — G1221 Amyotrophic lateral sclerosis: Secondary | ICD-10-CM | POA: Diagnosis not present

## 2017-02-11 DIAGNOSIS — Z9181 History of falling: Secondary | ICD-10-CM | POA: Diagnosis not present

## 2017-02-11 DIAGNOSIS — G1221 Amyotrophic lateral sclerosis: Secondary | ICD-10-CM | POA: Diagnosis not present

## 2017-02-11 DIAGNOSIS — I129 Hypertensive chronic kidney disease with stage 1 through stage 4 chronic kidney disease, or unspecified chronic kidney disease: Secondary | ICD-10-CM | POA: Diagnosis not present

## 2017-02-11 DIAGNOSIS — J45909 Unspecified asthma, uncomplicated: Secondary | ICD-10-CM | POA: Diagnosis not present

## 2017-02-11 DIAGNOSIS — Z7982 Long term (current) use of aspirin: Secondary | ICD-10-CM | POA: Diagnosis not present

## 2017-02-11 DIAGNOSIS — D485 Neoplasm of uncertain behavior of skin: Secondary | ICD-10-CM | POA: Diagnosis not present

## 2017-02-11 DIAGNOSIS — E1122 Type 2 diabetes mellitus with diabetic chronic kidney disease: Secondary | ICD-10-CM | POA: Diagnosis not present

## 2017-02-11 DIAGNOSIS — Z602 Problems related to living alone: Secondary | ICD-10-CM | POA: Diagnosis not present

## 2017-02-11 DIAGNOSIS — E890 Postprocedural hypothyroidism: Secondary | ICD-10-CM | POA: Diagnosis not present

## 2017-02-11 DIAGNOSIS — N182 Chronic kidney disease, stage 2 (mild): Secondary | ICD-10-CM | POA: Diagnosis not present

## 2017-02-15 DIAGNOSIS — I129 Hypertensive chronic kidney disease with stage 1 through stage 4 chronic kidney disease, or unspecified chronic kidney disease: Secondary | ICD-10-CM | POA: Diagnosis not present

## 2017-02-15 DIAGNOSIS — E890 Postprocedural hypothyroidism: Secondary | ICD-10-CM | POA: Diagnosis not present

## 2017-02-15 DIAGNOSIS — J45909 Unspecified asthma, uncomplicated: Secondary | ICD-10-CM | POA: Diagnosis not present

## 2017-02-15 DIAGNOSIS — D485 Neoplasm of uncertain behavior of skin: Secondary | ICD-10-CM | POA: Diagnosis not present

## 2017-02-15 DIAGNOSIS — Z7982 Long term (current) use of aspirin: Secondary | ICD-10-CM | POA: Diagnosis not present

## 2017-02-15 DIAGNOSIS — G1221 Amyotrophic lateral sclerosis: Secondary | ICD-10-CM | POA: Diagnosis not present

## 2017-02-15 DIAGNOSIS — E1122 Type 2 diabetes mellitus with diabetic chronic kidney disease: Secondary | ICD-10-CM | POA: Diagnosis not present

## 2017-02-15 DIAGNOSIS — N182 Chronic kidney disease, stage 2 (mild): Secondary | ICD-10-CM | POA: Diagnosis not present

## 2017-02-15 DIAGNOSIS — Z9181 History of falling: Secondary | ICD-10-CM | POA: Diagnosis not present

## 2017-02-15 DIAGNOSIS — Z602 Problems related to living alone: Secondary | ICD-10-CM | POA: Diagnosis not present

## 2017-02-16 DIAGNOSIS — C44329 Squamous cell carcinoma of skin of other parts of face: Secondary | ICD-10-CM | POA: Diagnosis not present

## 2017-02-17 DIAGNOSIS — N182 Chronic kidney disease, stage 2 (mild): Secondary | ICD-10-CM | POA: Diagnosis not present

## 2017-02-17 DIAGNOSIS — Z9181 History of falling: Secondary | ICD-10-CM | POA: Diagnosis not present

## 2017-02-17 DIAGNOSIS — G1221 Amyotrophic lateral sclerosis: Secondary | ICD-10-CM | POA: Diagnosis not present

## 2017-02-17 DIAGNOSIS — Z602 Problems related to living alone: Secondary | ICD-10-CM | POA: Diagnosis not present

## 2017-02-17 DIAGNOSIS — I129 Hypertensive chronic kidney disease with stage 1 through stage 4 chronic kidney disease, or unspecified chronic kidney disease: Secondary | ICD-10-CM | POA: Diagnosis not present

## 2017-02-17 DIAGNOSIS — E890 Postprocedural hypothyroidism: Secondary | ICD-10-CM | POA: Diagnosis not present

## 2017-02-17 DIAGNOSIS — J45909 Unspecified asthma, uncomplicated: Secondary | ICD-10-CM | POA: Diagnosis not present

## 2017-02-17 DIAGNOSIS — Z7982 Long term (current) use of aspirin: Secondary | ICD-10-CM | POA: Diagnosis not present

## 2017-02-17 DIAGNOSIS — D485 Neoplasm of uncertain behavior of skin: Secondary | ICD-10-CM | POA: Diagnosis not present

## 2017-02-17 DIAGNOSIS — E1122 Type 2 diabetes mellitus with diabetic chronic kidney disease: Secondary | ICD-10-CM | POA: Diagnosis not present

## 2017-02-18 DIAGNOSIS — Z602 Problems related to living alone: Secondary | ICD-10-CM | POA: Diagnosis not present

## 2017-02-18 DIAGNOSIS — J45909 Unspecified asthma, uncomplicated: Secondary | ICD-10-CM | POA: Diagnosis not present

## 2017-02-18 DIAGNOSIS — E1122 Type 2 diabetes mellitus with diabetic chronic kidney disease: Secondary | ICD-10-CM | POA: Diagnosis not present

## 2017-02-18 DIAGNOSIS — I129 Hypertensive chronic kidney disease with stage 1 through stage 4 chronic kidney disease, or unspecified chronic kidney disease: Secondary | ICD-10-CM | POA: Diagnosis not present

## 2017-02-18 DIAGNOSIS — D485 Neoplasm of uncertain behavior of skin: Secondary | ICD-10-CM | POA: Diagnosis not present

## 2017-02-18 DIAGNOSIS — Z7982 Long term (current) use of aspirin: Secondary | ICD-10-CM | POA: Diagnosis not present

## 2017-02-18 DIAGNOSIS — E890 Postprocedural hypothyroidism: Secondary | ICD-10-CM | POA: Diagnosis not present

## 2017-02-18 DIAGNOSIS — Z9181 History of falling: Secondary | ICD-10-CM | POA: Diagnosis not present

## 2017-02-18 DIAGNOSIS — G1221 Amyotrophic lateral sclerosis: Secondary | ICD-10-CM | POA: Diagnosis not present

## 2017-02-18 DIAGNOSIS — N182 Chronic kidney disease, stage 2 (mild): Secondary | ICD-10-CM | POA: Diagnosis not present

## 2017-03-03 DIAGNOSIS — G1221 Amyotrophic lateral sclerosis: Secondary | ICD-10-CM | POA: Diagnosis not present

## 2017-03-15 DIAGNOSIS — G1221 Amyotrophic lateral sclerosis: Secondary | ICD-10-CM | POA: Diagnosis not present

## 2017-03-18 ENCOUNTER — Other Ambulatory Visit: Payer: Medicare HMO

## 2017-03-22 ENCOUNTER — Telehealth: Payer: Self-pay | Admitting: Hematology and Oncology

## 2017-03-22 NOTE — Telephone Encounter (Signed)
Patient called to reschedule appointments. Rescheduled to next available.

## 2017-03-25 ENCOUNTER — Ambulatory Visit: Payer: Medicare HMO | Admitting: Hematology and Oncology

## 2017-04-03 DIAGNOSIS — G1221 Amyotrophic lateral sclerosis: Secondary | ICD-10-CM | POA: Diagnosis not present

## 2017-04-09 DIAGNOSIS — R079 Chest pain, unspecified: Secondary | ICD-10-CM | POA: Diagnosis not present

## 2017-04-09 DIAGNOSIS — G1221 Amyotrophic lateral sclerosis: Secondary | ICD-10-CM

## 2017-04-09 DIAGNOSIS — E785 Hyperlipidemia, unspecified: Secondary | ICD-10-CM

## 2017-04-09 DIAGNOSIS — I249 Acute ischemic heart disease, unspecified: Secondary | ICD-10-CM

## 2017-04-09 DIAGNOSIS — I10 Essential (primary) hypertension: Secondary | ICD-10-CM

## 2017-04-09 DIAGNOSIS — N189 Chronic kidney disease, unspecified: Secondary | ICD-10-CM

## 2017-04-09 DIAGNOSIS — R0789 Other chest pain: Secondary | ICD-10-CM | POA: Diagnosis not present

## 2017-04-10 DIAGNOSIS — R079 Chest pain, unspecified: Secondary | ICD-10-CM | POA: Diagnosis not present

## 2017-04-10 DIAGNOSIS — I214 Non-ST elevation (NSTEMI) myocardial infarction: Secondary | ICD-10-CM | POA: Diagnosis not present

## 2017-04-10 DIAGNOSIS — N189 Chronic kidney disease, unspecified: Secondary | ICD-10-CM | POA: Diagnosis not present

## 2017-04-10 DIAGNOSIS — I1310 Hypertensive heart and chronic kidney disease without heart failure, with stage 1 through stage 4 chronic kidney disease, or unspecified chronic kidney disease: Secondary | ICD-10-CM | POA: Diagnosis not present

## 2017-04-10 DIAGNOSIS — D649 Anemia, unspecified: Secondary | ICD-10-CM | POA: Diagnosis not present

## 2017-04-10 DIAGNOSIS — G1221 Amyotrophic lateral sclerosis: Secondary | ICD-10-CM | POA: Diagnosis not present

## 2017-04-10 DIAGNOSIS — I249 Acute ischemic heart disease, unspecified: Secondary | ICD-10-CM | POA: Diagnosis not present

## 2017-04-10 DIAGNOSIS — E784 Other hyperlipidemia: Secondary | ICD-10-CM | POA: Diagnosis not present

## 2017-04-10 DIAGNOSIS — I10 Essential (primary) hypertension: Secondary | ICD-10-CM | POA: Diagnosis not present

## 2017-04-10 DIAGNOSIS — E785 Hyperlipidemia, unspecified: Secondary | ICD-10-CM | POA: Diagnosis not present

## 2017-04-10 DIAGNOSIS — N184 Chronic kidney disease, stage 4 (severe): Secondary | ICD-10-CM | POA: Diagnosis not present

## 2017-04-11 DIAGNOSIS — Z7982 Long term (current) use of aspirin: Secondary | ICD-10-CM | POA: Diagnosis not present

## 2017-04-11 DIAGNOSIS — N189 Chronic kidney disease, unspecified: Secondary | ICD-10-CM | POA: Diagnosis not present

## 2017-04-11 DIAGNOSIS — K219 Gastro-esophageal reflux disease without esophagitis: Secondary | ICD-10-CM | POA: Diagnosis not present

## 2017-04-11 DIAGNOSIS — I129 Hypertensive chronic kidney disease with stage 1 through stage 4 chronic kidney disease, or unspecified chronic kidney disease: Secondary | ICD-10-CM | POA: Diagnosis not present

## 2017-04-11 DIAGNOSIS — E785 Hyperlipidemia, unspecified: Secondary | ICD-10-CM | POA: Diagnosis not present

## 2017-04-11 DIAGNOSIS — Z66 Do not resuscitate: Secondary | ICD-10-CM | POA: Diagnosis not present

## 2017-04-11 DIAGNOSIS — Z79899 Other long term (current) drug therapy: Secondary | ICD-10-CM | POA: Diagnosis not present

## 2017-04-11 DIAGNOSIS — N184 Chronic kidney disease, stage 4 (severe): Secondary | ICD-10-CM | POA: Diagnosis not present

## 2017-04-11 DIAGNOSIS — I214 Non-ST elevation (NSTEMI) myocardial infarction: Secondary | ICD-10-CM | POA: Diagnosis not present

## 2017-04-11 DIAGNOSIS — N179 Acute kidney failure, unspecified: Secondary | ICD-10-CM | POA: Diagnosis not present

## 2017-04-11 DIAGNOSIS — I1310 Hypertensive heart and chronic kidney disease without heart failure, with stage 1 through stage 4 chronic kidney disease, or unspecified chronic kidney disease: Secondary | ICD-10-CM | POA: Diagnosis not present

## 2017-04-11 DIAGNOSIS — R54 Age-related physical debility: Secondary | ICD-10-CM | POA: Diagnosis not present

## 2017-04-11 DIAGNOSIS — I10 Essential (primary) hypertension: Secondary | ICD-10-CM | POA: Diagnosis not present

## 2017-04-11 DIAGNOSIS — I249 Acute ischemic heart disease, unspecified: Secondary | ICD-10-CM | POA: Diagnosis not present

## 2017-04-11 DIAGNOSIS — E1122 Type 2 diabetes mellitus with diabetic chronic kidney disease: Secondary | ICD-10-CM | POA: Diagnosis not present

## 2017-04-11 DIAGNOSIS — G1221 Amyotrophic lateral sclerosis: Secondary | ICD-10-CM | POA: Diagnosis not present

## 2017-04-22 ENCOUNTER — Other Ambulatory Visit: Payer: Medicare HMO

## 2017-04-26 DEATH — deceased

## 2017-04-29 ENCOUNTER — Ambulatory Visit: Payer: Medicare HMO | Admitting: Hematology and Oncology
